# Patient Record
Sex: Female | Born: 1935 | Race: Black or African American | Hispanic: No | Smoking: Former smoker
Health system: Southern US, Community
[De-identification: ages and names within clinical notes are randomized; demographics above are authoritative.]

## PROBLEM LIST (undated history)

## (undated) DIAGNOSIS — Z8739 Personal history of other diseases of the musculoskeletal system and connective tissue: Secondary | ICD-10-CM

## (undated) DIAGNOSIS — E119 Type 2 diabetes mellitus without complications: Secondary | ICD-10-CM

## (undated) DIAGNOSIS — M791 Myalgia, unspecified site: Secondary | ICD-10-CM

## (undated) DIAGNOSIS — I10 Essential (primary) hypertension: Secondary | ICD-10-CM

## (undated) DIAGNOSIS — D649 Anemia, unspecified: Secondary | ICD-10-CM

## (undated) DIAGNOSIS — I4891 Unspecified atrial fibrillation: Secondary | ICD-10-CM

## (undated) DIAGNOSIS — R413 Other amnesia: Secondary | ICD-10-CM

## (undated) DIAGNOSIS — I509 Heart failure, unspecified: Secondary | ICD-10-CM

## (undated) DIAGNOSIS — H9190 Unspecified hearing loss, unspecified ear: Secondary | ICD-10-CM

## (undated) DIAGNOSIS — J449 Chronic obstructive pulmonary disease, unspecified: Secondary | ICD-10-CM

## (undated) DIAGNOSIS — R252 Cramp and spasm: Secondary | ICD-10-CM

## (undated) DIAGNOSIS — R233 Spontaneous ecchymoses: Secondary | ICD-10-CM

## (undated) DIAGNOSIS — M810 Age-related osteoporosis without current pathological fracture: Secondary | ICD-10-CM

## (undated) DIAGNOSIS — M069 Rheumatoid arthritis, unspecified: Secondary | ICD-10-CM

## (undated) DIAGNOSIS — J45909 Unspecified asthma, uncomplicated: Secondary | ICD-10-CM

## (undated) DIAGNOSIS — R238 Other skin changes: Secondary | ICD-10-CM

## (undated) HISTORY — DX: Other skin changes: R23.8

## (undated) HISTORY — DX: Chronic obstructive pulmonary disease, unspecified: J44.9

## (undated) HISTORY — DX: Spontaneous ecchymoses: R23.3

## (undated) HISTORY — DX: Personal history of other diseases of the musculoskeletal system and connective tissue: Z87.39

## (undated) HISTORY — DX: Myalgia, unspecified site: M79.10

## (undated) HISTORY — DX: Age-related osteoporosis without current pathological fracture: M81.0

## (undated) HISTORY — DX: Essential (primary) hypertension: I10

## (undated) HISTORY — DX: Anemia, unspecified: D64.9

## (undated) HISTORY — PX: HIP SURGERY: SHX245

## (undated) HISTORY — PX: KNEE SURGERY: SHX244

## (undated) HISTORY — DX: Cramp and spasm: R25.2

## (undated) HISTORY — PX: GALLBLADDER SURGERY: SHX652

## (undated) HISTORY — DX: Unspecified hearing loss, unspecified ear: H91.90

## (undated) HISTORY — DX: Unspecified atrial fibrillation: I48.91

## (undated) HISTORY — PX: COLONOSCOPY: SHX174

## (undated) HISTORY — DX: Type 2 diabetes mellitus without complications: E11.9

## (undated) HISTORY — DX: Other amnesia: R41.3

## (undated) HISTORY — DX: Rheumatoid arthritis, unspecified: M06.9

## (undated) HISTORY — DX: Heart failure, unspecified: I50.9

## (undated) HISTORY — DX: Unspecified asthma, uncomplicated: J45.909

## (undated) HISTORY — PX: EYE SURGERY: SHX253

---

## 1997-11-13 ENCOUNTER — Other Ambulatory Visit: Admission: RE | Admit: 1997-11-13 | Discharge: 1997-11-13 | Payer: Self-pay | Admitting: Obstetrics and Gynecology

## 1998-01-23 ENCOUNTER — Encounter: Payer: Self-pay | Admitting: Obstetrics and Gynecology

## 1998-01-23 ENCOUNTER — Ambulatory Visit (HOSPITAL_COMMUNITY): Admission: RE | Admit: 1998-01-23 | Discharge: 1998-01-23 | Payer: Self-pay | Admitting: Obstetrics and Gynecology

## 1999-03-04 ENCOUNTER — Other Ambulatory Visit: Admission: RE | Admit: 1999-03-04 | Discharge: 1999-03-04 | Payer: Self-pay | Admitting: Obstetrics and Gynecology

## 1999-03-04 ENCOUNTER — Encounter: Payer: Self-pay | Admitting: Obstetrics and Gynecology

## 1999-03-04 ENCOUNTER — Ambulatory Visit (HOSPITAL_COMMUNITY): Admission: RE | Admit: 1999-03-04 | Discharge: 1999-03-04 | Payer: Self-pay | Admitting: Obstetrics and Gynecology

## 2000-03-16 ENCOUNTER — Ambulatory Visit (HOSPITAL_COMMUNITY): Admission: RE | Admit: 2000-03-16 | Discharge: 2000-03-16 | Payer: Self-pay | Admitting: Obstetrics and Gynecology

## 2000-03-16 ENCOUNTER — Encounter: Payer: Self-pay | Admitting: Obstetrics and Gynecology

## 2000-03-16 ENCOUNTER — Other Ambulatory Visit: Admission: RE | Admit: 2000-03-16 | Discharge: 2000-03-16 | Payer: Self-pay | Admitting: Obstetrics and Gynecology

## 2001-05-26 ENCOUNTER — Ambulatory Visit (HOSPITAL_COMMUNITY): Admission: RE | Admit: 2001-05-26 | Discharge: 2001-05-26 | Payer: Self-pay | Admitting: Obstetrics and Gynecology

## 2001-05-26 ENCOUNTER — Encounter: Payer: Self-pay | Admitting: Obstetrics and Gynecology

## 2001-05-26 ENCOUNTER — Other Ambulatory Visit: Admission: RE | Admit: 2001-05-26 | Discharge: 2001-05-26 | Payer: Self-pay | Admitting: Obstetrics and Gynecology

## 2001-06-07 ENCOUNTER — Ambulatory Visit (HOSPITAL_COMMUNITY): Admission: RE | Admit: 2001-06-07 | Discharge: 2001-06-07 | Payer: Self-pay | Admitting: Obstetrics and Gynecology

## 2001-06-07 ENCOUNTER — Encounter: Payer: Self-pay | Admitting: Obstetrics and Gynecology

## 2004-04-18 ENCOUNTER — Ambulatory Visit: Payer: Self-pay | Admitting: Unknown Physician Specialty

## 2004-09-12 ENCOUNTER — Ambulatory Visit: Payer: Self-pay | Admitting: Internal Medicine

## 2004-10-17 ENCOUNTER — Ambulatory Visit: Payer: Self-pay | Admitting: Unknown Physician Specialty

## 2005-04-24 ENCOUNTER — Ambulatory Visit: Payer: Self-pay | Admitting: Unknown Physician Specialty

## 2005-05-08 ENCOUNTER — Ambulatory Visit: Payer: Self-pay | Admitting: Unknown Physician Specialty

## 2005-10-02 ENCOUNTER — Ambulatory Visit: Payer: Self-pay | Admitting: Internal Medicine

## 2006-01-08 ENCOUNTER — Ambulatory Visit: Payer: Self-pay | Admitting: Specialist

## 2006-01-08 ENCOUNTER — Ambulatory Visit: Payer: Self-pay

## 2006-05-05 ENCOUNTER — Ambulatory Visit: Payer: Self-pay | Admitting: Specialist

## 2006-10-07 ENCOUNTER — Ambulatory Visit: Payer: Self-pay | Admitting: Internal Medicine

## 2007-01-14 ENCOUNTER — Ambulatory Visit: Payer: Self-pay | Admitting: General Practice

## 2007-04-27 ENCOUNTER — Ambulatory Visit: Payer: Self-pay | Admitting: General Practice

## 2007-05-04 ENCOUNTER — Inpatient Hospital Stay: Payer: Self-pay | Admitting: General Practice

## 2007-05-11 ENCOUNTER — Encounter: Payer: Self-pay | Admitting: Internal Medicine

## 2007-09-22 ENCOUNTER — Inpatient Hospital Stay: Payer: Self-pay | Admitting: Specialist

## 2007-09-22 ENCOUNTER — Other Ambulatory Visit: Payer: Self-pay

## 2007-09-23 ENCOUNTER — Other Ambulatory Visit: Payer: Self-pay

## 2007-10-12 ENCOUNTER — Inpatient Hospital Stay: Payer: Self-pay | Admitting: Internal Medicine

## 2007-10-19 ENCOUNTER — Encounter: Payer: Self-pay | Admitting: Internal Medicine

## 2007-11-08 ENCOUNTER — Ambulatory Visit: Payer: Self-pay | Admitting: Unknown Physician Specialty

## 2007-11-26 ENCOUNTER — Ambulatory Visit: Payer: Self-pay | Admitting: Internal Medicine

## 2007-11-30 ENCOUNTER — Ambulatory Visit: Payer: Self-pay | Admitting: Internal Medicine

## 2008-01-06 ENCOUNTER — Ambulatory Visit: Payer: Self-pay | Admitting: Family Medicine

## 2008-01-06 DIAGNOSIS — M810 Age-related osteoporosis without current pathological fracture: Secondary | ICD-10-CM | POA: Insufficient documentation

## 2008-01-06 DIAGNOSIS — I10 Essential (primary) hypertension: Secondary | ICD-10-CM

## 2008-01-20 ENCOUNTER — Ambulatory Visit: Payer: Self-pay | Admitting: Family Medicine

## 2008-01-20 DIAGNOSIS — E669 Obesity, unspecified: Secondary | ICD-10-CM | POA: Insufficient documentation

## 2008-04-13 ENCOUNTER — Ambulatory Visit: Payer: Self-pay | Admitting: Internal Medicine

## 2008-07-31 ENCOUNTER — Ambulatory Visit: Payer: Self-pay | Admitting: Otolaryngology

## 2010-10-11 ENCOUNTER — Ambulatory Visit: Payer: Self-pay | Admitting: Ophthalmology

## 2010-10-21 ENCOUNTER — Ambulatory Visit: Payer: Self-pay | Admitting: Ophthalmology

## 2010-11-26 ENCOUNTER — Ambulatory Visit: Payer: Self-pay | Admitting: Unknown Physician Specialty

## 2011-11-19 ENCOUNTER — Ambulatory Visit: Payer: Self-pay | Admitting: Internal Medicine

## 2013-03-15 ENCOUNTER — Encounter: Payer: Self-pay | Admitting: Podiatry

## 2013-03-16 ENCOUNTER — Encounter: Payer: Self-pay | Admitting: Podiatry

## 2013-03-16 ENCOUNTER — Ambulatory Visit (INDEPENDENT_AMBULATORY_CARE_PROVIDER_SITE_OTHER): Payer: Medicare Other | Admitting: Podiatry

## 2013-03-16 VITALS — BP 134/51 | HR 68 | Resp 16 | Ht 65.0 in | Wt 162.0 lb

## 2013-03-16 DIAGNOSIS — E1149 Type 2 diabetes mellitus with other diabetic neurological complication: Secondary | ICD-10-CM

## 2013-03-16 DIAGNOSIS — Q828 Other specified congenital malformations of skin: Secondary | ICD-10-CM

## 2013-03-16 NOTE — Progress Notes (Signed)
She presents today with a chief complaint of painful porokeratotic lesions plantar aspect of the bilateral foot.  Objective: Vital signs are stable she is alert and oriented x3. Porokeratotic lesions sub-first sub-fifth and scattered about the foot bilaterally.  Assessment: Pain in limb secondary to porokeratotic lesions.  Plan: Debridement of all reactive hyperkeratosis followup with me as needed.

## 2013-04-21 ENCOUNTER — Ambulatory Visit: Payer: Self-pay | Admitting: Internal Medicine

## 2013-06-08 ENCOUNTER — Ambulatory Visit (INDEPENDENT_AMBULATORY_CARE_PROVIDER_SITE_OTHER): Payer: Medicare Other | Admitting: Podiatry

## 2013-06-08 ENCOUNTER — Encounter: Payer: Self-pay | Admitting: Podiatry

## 2013-06-08 VITALS — BP 148/76 | HR 60 | Resp 16

## 2013-06-08 DIAGNOSIS — Q828 Other specified congenital malformations of skin: Secondary | ICD-10-CM

## 2013-06-08 DIAGNOSIS — E1149 Type 2 diabetes mellitus with other diabetic neurological complication: Secondary | ICD-10-CM

## 2013-06-08 NOTE — Progress Notes (Signed)
My corns and calluses.  Objective: Vital signs are stable she is alert and oriented x3. She states her diabetes is doing well. Pulses are strongly palpable bilateral. Multiple areas of reactive hyperkeratosis plantar aspect of the bilateral foot and fifth digits bilaterally. No ulcerations no signs of infection.  Assessment: Diabetes with a reactive hyperkeratosis bilateral.  Plan: Debridement of all reactive hyperkeratosis today followup with her as needed.

## 2013-06-15 ENCOUNTER — Ambulatory Visit: Payer: Self-pay | Admitting: Podiatry

## 2013-07-13 DIAGNOSIS — I4891 Unspecified atrial fibrillation: Secondary | ICD-10-CM | POA: Insufficient documentation

## 2013-07-13 DIAGNOSIS — E7800 Pure hypercholesterolemia, unspecified: Secondary | ICD-10-CM | POA: Insufficient documentation

## 2013-07-13 DIAGNOSIS — M069 Rheumatoid arthritis, unspecified: Secondary | ICD-10-CM | POA: Insufficient documentation

## 2013-07-13 DIAGNOSIS — I6789 Other cerebrovascular disease: Secondary | ICD-10-CM | POA: Insufficient documentation

## 2013-07-13 DIAGNOSIS — B351 Tinea unguium: Secondary | ICD-10-CM | POA: Insufficient documentation

## 2013-07-13 DIAGNOSIS — J431 Panlobular emphysema: Secondary | ICD-10-CM | POA: Insufficient documentation

## 2013-07-13 DIAGNOSIS — J449 Chronic obstructive pulmonary disease, unspecified: Secondary | ICD-10-CM | POA: Insufficient documentation

## 2013-07-13 DIAGNOSIS — D649 Anemia, unspecified: Secondary | ICD-10-CM | POA: Insufficient documentation

## 2013-07-13 DIAGNOSIS — M898X9 Other specified disorders of bone, unspecified site: Secondary | ICD-10-CM | POA: Insufficient documentation

## 2013-07-13 DIAGNOSIS — M76899 Other specified enthesopathies of unspecified lower limb, excluding foot: Secondary | ICD-10-CM | POA: Insufficient documentation

## 2013-07-13 DIAGNOSIS — M659 Synovitis and tenosynovitis, unspecified: Secondary | ICD-10-CM | POA: Insufficient documentation

## 2013-07-13 DIAGNOSIS — E119 Type 2 diabetes mellitus without complications: Secondary | ICD-10-CM | POA: Insufficient documentation

## 2013-09-02 ENCOUNTER — Ambulatory Visit: Payer: Self-pay | Admitting: Internal Medicine

## 2013-09-12 ENCOUNTER — Encounter: Payer: Self-pay | Admitting: Podiatry

## 2013-09-12 ENCOUNTER — Ambulatory Visit: Payer: Medicare Other | Admitting: Podiatry

## 2013-09-12 DIAGNOSIS — M79609 Pain in unspecified limb: Secondary | ICD-10-CM

## 2013-09-12 DIAGNOSIS — E1149 Type 2 diabetes mellitus with other diabetic neurological complication: Secondary | ICD-10-CM

## 2013-09-12 DIAGNOSIS — Q828 Other specified congenital malformations of skin: Secondary | ICD-10-CM

## 2013-09-12 NOTE — Progress Notes (Signed)
She presents today with a chief complaint of painful elongated toenails and calluses.  Objective: Pulses are strongly palpable bilateral. Nails are thick yellow dystrophic with mycotic reactive hyperkeratosis plantar aspect of the bilateral foot as well as the digits.  Assessment: Reactive hyperkeratosis bilateral.  Plan: Debridement of all reactive hyperkeratosis.

## 2013-11-23 ENCOUNTER — Ambulatory Visit (INDEPENDENT_AMBULATORY_CARE_PROVIDER_SITE_OTHER): Payer: Medicare Other | Admitting: Podiatry

## 2013-11-23 DIAGNOSIS — E1149 Type 2 diabetes mellitus with other diabetic neurological complication: Secondary | ICD-10-CM

## 2013-11-23 DIAGNOSIS — Q828 Other specified congenital malformations of skin: Secondary | ICD-10-CM

## 2013-11-23 NOTE — Progress Notes (Signed)
She presents today with a chief complaint of painful corns and calluses plantar aspect of the bilateral foot.  Objective: Pulses are palpable bilateral. Multiple porokeratotic lesions plantar aspect of the bilateral foot.  Assessment: Porokeratosis bilateral.  Plan: Debridement of reactive hyperkeratosis secondary to pain bilateral.

## 2014-01-30 ENCOUNTER — Ambulatory Visit (INDEPENDENT_AMBULATORY_CARE_PROVIDER_SITE_OTHER): Payer: Medicare Other | Admitting: Podiatry

## 2014-01-30 DIAGNOSIS — Q828 Other specified congenital malformations of skin: Secondary | ICD-10-CM

## 2014-01-30 NOTE — Progress Notes (Signed)
She resists today complaining of corns and calluses to the bilateral foot.   Objective: Pulses are strongly palpable bilateral. Multiple porokeratotic lesions plantar aspect of the bilateral foot including distal clavi.  Assessment: Porokeratosis bilateral.  Plan: Debridement of all reactive hyperkeratosis and placed padding.

## 2014-02-01 ENCOUNTER — Ambulatory Visit: Payer: Federal, State, Local not specified - PPO | Admitting: Podiatry

## 2014-04-17 ENCOUNTER — Ambulatory Visit: Payer: Federal, State, Local not specified - PPO | Admitting: Podiatry

## 2014-04-19 ENCOUNTER — Ambulatory Visit (INDEPENDENT_AMBULATORY_CARE_PROVIDER_SITE_OTHER): Payer: Medicare Other | Admitting: Podiatry

## 2014-04-19 DIAGNOSIS — Q828 Other specified congenital malformations of skin: Secondary | ICD-10-CM

## 2014-04-19 NOTE — Progress Notes (Signed)
She presents today with chief complaint of painful calluses bilateral.  Objective: Vital signs are stable she is alert and oriented 3 pulses are palpable bilateral. Multiple porokeratotic lesions plantar aspect of the bilateral foot.  Assessment: Pain and limb secondary to porokeratosis bilateral.  Plan: Debridement of porokeratotic lesions plantar aspect bilateral foot.

## 2014-06-19 ENCOUNTER — Ambulatory Visit (INDEPENDENT_AMBULATORY_CARE_PROVIDER_SITE_OTHER): Payer: Medicare Other | Admitting: Podiatry

## 2014-06-19 ENCOUNTER — Encounter: Payer: Self-pay | Admitting: Podiatry

## 2014-06-19 DIAGNOSIS — Q828 Other specified congenital malformations of skin: Secondary | ICD-10-CM

## 2014-06-19 NOTE — Progress Notes (Signed)
She presents today with chief complaint of painful calluses bilateral.  Objective: She is alert and oriented 3 pulses are palpable bilateral. Multiple porokeratotic lesions plantar aspect of the bilateral foot.  Assessment: Pain and limb secondary to porokeratosis bilateral.  Plan: Debridement of porokeratotic lesions plantar aspect bilateral foot.

## 2014-09-06 ENCOUNTER — Ambulatory Visit (INDEPENDENT_AMBULATORY_CARE_PROVIDER_SITE_OTHER): Payer: Medicare Other | Admitting: Podiatry

## 2014-09-06 ENCOUNTER — Encounter: Payer: Self-pay | Admitting: Podiatry

## 2014-09-06 DIAGNOSIS — Q828 Other specified congenital malformations of skin: Secondary | ICD-10-CM | POA: Diagnosis not present

## 2014-09-06 NOTE — Progress Notes (Signed)
She presents today complaining of bilateral foot pain and calluses.  Objective: Vital signs are stable she is alert and oriented 3 pulses remain palpable bilateral but minimally so. Capillary fill time is immediate. Multiple varicosities and 4 keratomas noted to the plantar aspect of bilateral foot no open lesions no wounds.  Assessment: Porokeratosis bilateral.  Plan: Debridement already hyperkeratoses follow up with her in the near future for re-debridement.

## 2014-12-13 ENCOUNTER — Encounter: Payer: Self-pay | Admitting: Podiatry

## 2014-12-13 ENCOUNTER — Ambulatory Visit (INDEPENDENT_AMBULATORY_CARE_PROVIDER_SITE_OTHER): Payer: Medicare Other

## 2014-12-13 ENCOUNTER — Ambulatory Visit (INDEPENDENT_AMBULATORY_CARE_PROVIDER_SITE_OTHER): Payer: Medicare Other | Admitting: Podiatry

## 2014-12-13 DIAGNOSIS — M778 Other enthesopathies, not elsewhere classified: Secondary | ICD-10-CM

## 2014-12-13 DIAGNOSIS — Q828 Other specified congenital malformations of skin: Secondary | ICD-10-CM | POA: Diagnosis not present

## 2014-12-13 DIAGNOSIS — M7752 Other enthesopathy of left foot: Secondary | ICD-10-CM | POA: Diagnosis not present

## 2014-12-13 DIAGNOSIS — M779 Enthesopathy, unspecified: Principal | ICD-10-CM

## 2014-12-13 NOTE — Progress Notes (Signed)
She presents today with a chief complaint of pain to the first metatarsophalangeal joint extending along the medial aspect of the foot and up the anterior aspect of the left foot. She states that she also has pain here she points to the sinus tarsi of the left foot. She also goes on to state that she would like to have her corns and calluses debrided.  Objective: Vital signs are stable she is alert and oriented 3 pulses are strong palpable bilateral. Neurologic sensorium is intact per Semmes-Weinstein monofilament though does have a history of mild diabetic peripheral neuropathy. Multiple callosities plantar aspect of the bilateral foot and between the digits. Orthopedic evaluation demonstrates no reproducible pain on evaluation of the left foot. Radiographs 3 views of the left foot taken in the office today demonstrate a bunion deformity with osteoarthritic changes of the midfoot.  Assessment: Diabetic peripheral neuropathy with interdigital callosities and porokeratosis plantar aspect of the bilateral foot. Capsulitis first metatarsophalangeal joint left foot.  Plan: Debrided all reactive hyperkeratoses bilateral foot. More than forward debrided. I also offered her an injection to the first metatarsophalangeal joint and the sinus tarsi which she declined today. I will follow-up with her in 2 months for debridement.

## 2015-02-14 ENCOUNTER — Encounter: Payer: Self-pay | Admitting: Podiatry

## 2015-02-14 ENCOUNTER — Ambulatory Visit (INDEPENDENT_AMBULATORY_CARE_PROVIDER_SITE_OTHER): Payer: Medicare Other | Admitting: Podiatry

## 2015-02-14 DIAGNOSIS — Q828 Other specified congenital malformations of skin: Secondary | ICD-10-CM | POA: Diagnosis not present

## 2015-02-14 DIAGNOSIS — B351 Tinea unguium: Secondary | ICD-10-CM

## 2015-02-14 DIAGNOSIS — M79676 Pain in unspecified toe(s): Secondary | ICD-10-CM

## 2015-02-14 NOTE — Progress Notes (Signed)
She presents today with chief complaint of painful corns and calluses bilateral. She states that she doesn't want the callus on the plantar aspect of her right foot trimmed because it becomes painful on occasions.  Objective: Vital signs are stable she is alert and oriented 3. Pulses are palpable bilateral. Reactive hyperkeratosis of present plantar aspect bilateral foot no open lesions or wounds noted.  Assessment: Porokeratosis bilateral.  Plan: Debridement of all reactive hyperkeratotic tissue follow-up with [place] in the next few months.

## 2015-03-12 ENCOUNTER — Telehealth: Payer: Self-pay | Admitting: *Deleted

## 2015-03-12 DIAGNOSIS — M79673 Pain in unspecified foot: Secondary | ICD-10-CM

## 2015-03-12 NOTE — Telephone Encounter (Signed)
Tammy-can you call patient and let her know that we do have the RevitaDerm and discuss cost. Thanks!!

## 2015-03-12 NOTE — Telephone Encounter (Signed)
Called patient and she will pick up 2 jars of Revitaderm on Friday. Has paid $36 for both jars via [employer] made pyment over the phone . Th 03/12/2015

## 2015-03-12 NOTE — Telephone Encounter (Signed)
Pt states she is trying to see if the Revitaderm40 is available.

## 2015-04-16 ENCOUNTER — Ambulatory Visit: Payer: Medicare Other | Admitting: Podiatry

## 2015-04-18 ENCOUNTER — Ambulatory Visit: Payer: Medicare Other | Admitting: Podiatry

## 2015-04-20 ENCOUNTER — Encounter: Payer: Self-pay | Admitting: Sports Medicine

## 2015-04-20 ENCOUNTER — Ambulatory Visit (INDEPENDENT_AMBULATORY_CARE_PROVIDER_SITE_OTHER): Payer: Medicare Other | Admitting: Sports Medicine

## 2015-04-20 DIAGNOSIS — R29898 Other symptoms and signs involving the musculoskeletal system: Secondary | ICD-10-CM

## 2015-04-20 DIAGNOSIS — M21619 Bunion of unspecified foot: Secondary | ICD-10-CM

## 2015-04-20 DIAGNOSIS — Q828 Other specified congenital malformations of skin: Secondary | ICD-10-CM | POA: Diagnosis not present

## 2015-04-20 DIAGNOSIS — M79672 Pain in left foot: Secondary | ICD-10-CM

## 2015-04-20 DIAGNOSIS — L909 Atrophic disorder of skin, unspecified: Secondary | ICD-10-CM

## 2015-04-20 DIAGNOSIS — M79671 Pain in right foot: Secondary | ICD-10-CM

## 2015-04-20 DIAGNOSIS — M2040 Other hammer toe(s) (acquired), unspecified foot: Secondary | ICD-10-CM

## 2015-04-20 NOTE — Progress Notes (Addendum)
Patient ID: Tracy Acevedo, female   DOB: 1925-11-29, 82 y.o.   MRN: [id number] Subjective: Tracy Acevedo is a 82 y.o. female patient who presents to office for callus care; reports that when these build up can be very tender and difficult to walk due to pain; patient has been coming every 2 months for callus care which offers symptomatic relief. Patient also admits to positional numbness in right>left leg of which her PCP started her on meds for to help. Patient denies any other pedal complaints.   Patient Active Problem List   Diagnosis Date Noted  . OBESITY, UNSPECIFIED 01/20/2008  . ESSENTIAL HYPERTENSION, BENIGN 01/06/2008  . UNSPECIFIED OSTEOPOROSIS 01/06/2008   Current Outpatient Prescriptions on File Prior to Visit  Medication Sig Dispense Refill  . albuterol (VENTOLIN HFA) 108 (90 BASE) MCG/ACT inhaler Inhale 1 puff into the lungs every 6 (six) hours as needed for wheezing or shortness of breath.    . cetirizine (ZYRTEC) 10 MG tablet Take 10 mg by mouth daily.    . dabigatran (PRADAXA) 150 MG CAPS capsule Take 150 mg by mouth 2 (two) times daily.    . digoxin (LANOXIN) 0.25 MG tablet Take 0.25 mg by mouth daily.    Marland Kitchen diltiazem (TIAZAC) 360 MG 24 hr capsule Take 360 mg by mouth daily.    Marland Kitchen EPINEPHrine (EPIPEN) 0.3 mg/0.3 mL SOAJ injection Inject 0.3 mg into the muscle once.    Marland Kitchen esomeprazole (NEXIUM) 40 MG capsule Take 40 mg by mouth daily at 12 noon.    . Eszopiclone (ESZOPICLONE) 3 MG TABS Take 3 mg by mouth at bedtime. Take immediately before bedtime    . Fluticasone-Salmeterol (ADVAIR) 250-50 MCG/DOSE AEPB Inhale 1 puff into the lungs 2 (two) times daily.    . irbesartan-hydrochlorothiazide (AVALIDE) 300-12.5 MG per tablet Take 1 tablet by mouth daily.    . metoprolol succinate (TOPROL-XL) 50 MG 24 hr tablet Take 50 mg by mouth daily. Take with or immediately following a meal.    . montelukast (SINGULAIR) 10 MG tablet Take 10 mg by mouth at bedtime.    . Triamcinolone Acetonide  (NASACORT AQ NA) Place into the nose daily.     No current facility-administered medications on file prior to visit.   Allergies  Allergen Reactions  . Aspirin Nausea And Vomiting  . Penicillins Nausea And Vomiting  . Sulfa Antibiotics Nausea And Vomiting  . Tramadol Itching    hives  . Tramadol Hcl    Objective:  General: Alert and oriented x3 in no acute distress; hard of hearing; Walker assisted gait.  Dermatology: Keratotic lesion present with central nucleated core Left 5th toe dorsal PIPJ, sub met 5 and medial hallux, Right 5th toe dorsal PIPJ, sub met 5, 3rd toe distal tuft, and sub met 2 with no signs of underlying opening or infection, no webspace macerations, no ecchymosis bilateral, all nails x 10 are well manicured.  Vascular: Dorsalis Pedis and Posterior Tibial pedal pulses 1/4, Capillary Fill Time 3 seconds, scant pedal hair growth bilateral, no edema bilateral lower extremities, +mild varicosities bilateral, Temperature gradient within normal limits.  Neurology: Johney Maine sensation intact via light touch bilateral.  Musculoskeletal: Mild tenderness with palpation to keratotic lesions R>L, + Fat pad atrophy and structural deformity, Muscular strength within normal limits.   Assessment and Plan: Problem List Items Addressed This Visit    None    Visit Diagnoses    Porokeratosis    -  Primary    Fat pad atrophy  of foot        Bunion        Hammertoe, unspecified laterality        Foot pain, bilateral          -Complete examination performed -Discussed treatement options of porokeratotic lesions -Parred keratoic lesions x7 using a chisel blade; treated the area with Salinocaine covered with moleskin; keep intact for 1 day -Recommend good supportive shoes and inserts for foot type -Recommend daily skin emollients to help soften keratotic areas -Patient to return to office in 2 months for keratotic lesion/callus care or sooner if condition worsens.  Landis Martins,  DPM

## 2015-05-15 ENCOUNTER — Encounter: Payer: Self-pay | Admitting: Pharmacist

## 2015-06-19 ENCOUNTER — Encounter: Payer: Self-pay | Admitting: Sports Medicine

## 2015-06-19 ENCOUNTER — Ambulatory Visit: Payer: Medicare Other | Admitting: Sports Medicine

## 2015-06-19 ENCOUNTER — Ambulatory Visit (INDEPENDENT_AMBULATORY_CARE_PROVIDER_SITE_OTHER): Payer: Medicare Other | Admitting: Sports Medicine

## 2015-06-19 DIAGNOSIS — Q828 Other specified congenital malformations of skin: Secondary | ICD-10-CM

## 2015-06-19 DIAGNOSIS — M21619 Bunion of unspecified foot: Secondary | ICD-10-CM

## 2015-06-19 DIAGNOSIS — M79671 Pain in right foot: Secondary | ICD-10-CM | POA: Diagnosis not present

## 2015-06-19 DIAGNOSIS — M2040 Other hammer toe(s) (acquired), unspecified foot: Secondary | ICD-10-CM

## 2015-06-19 DIAGNOSIS — M79672 Pain in left foot: Secondary | ICD-10-CM

## 2015-06-19 DIAGNOSIS — E119 Type 2 diabetes mellitus without complications: Secondary | ICD-10-CM

## 2015-06-19 DIAGNOSIS — R29898 Other symptoms and signs involving the musculoskeletal system: Secondary | ICD-10-CM

## 2015-06-19 DIAGNOSIS — L909 Atrophic disorder of skin, unspecified: Secondary | ICD-10-CM

## 2015-06-19 NOTE — Progress Notes (Signed)
Patient ID: SILVA AAMODT, female   DOB: 09/29/1925, 82 y.o.   MRN: [id number]  Subjective: Tracy Acevedo is a 82 y.o. female patient who returns to office for callus care; patient has been coming every 2 months for callus care which offers symptomatic relief. Patient admits to a fall since last visit with no injury or loss of consciousness. Patient denies any other pedal complaints.   Patient Active Problem List   Diagnosis Date Noted  . Acute ill-defined cerebrovascular disease 07/13/2013  . Absolute anemia 07/13/2013  . Asthma-chronic obstructive pulmonary disease overlap syndrome ([place]) 07/13/2013  . Atrial fibrillation ([place]) 07/13/2013  . Chronic obstructive pulmonary disease ([place]) 07/13/2013  . Dermatophytic onychia 07/13/2013  . Diabetes mellitus ([place]) 07/13/2013  . Enthesopathy of hip 07/13/2013  . Bony exostosis 07/13/2013  . Pure hypercholesterolemia 07/13/2013  . Rheumatoid arthritis ([place]) 07/13/2013  . Synovitis and tenosynovitis 07/13/2013  . OBESITY, UNSPECIFIED 01/20/2008  . Essential hypertension, benign 01/06/2008  . UNSPECIFIED OSTEOPOROSIS 01/06/2008   Current Outpatient Prescriptions on File Prior to Visit  Medication Sig Dispense Refill  . albuterol (VENTOLIN HFA) 108 (90 BASE) MCG/ACT inhaler Inhale 1 puff into the lungs every 6 (six) hours as needed for wheezing or shortness of breath.    . cetirizine (ZYRTEC) 10 MG tablet Take 10 mg by mouth daily.    . dabigatran (PRADAXA) 150 MG CAPS capsule Take 150 mg by mouth 2 (two) times daily.    . digoxin (LANOXIN) 0.25 MG tablet Take 0.25 mg by mouth daily.    Marland Kitchen diltiazem (TIAZAC) 360 MG 24 hr capsule Take 360 mg by mouth daily.    Marland Kitchen EPINEPHrine (EPIPEN) 0.3 mg/0.3 mL SOAJ injection Inject 0.3 mg into the muscle once.    Marland Kitchen esomeprazole (NEXIUM) 40 MG capsule Take 40 mg by mouth daily at 12 noon.    . Eszopiclone (ESZOPICLONE) 3 MG TABS Take 3 mg by mouth at bedtime. Take immediately before bedtime    .  Fluticasone-Salmeterol (ADVAIR) 250-50 MCG/DOSE AEPB Inhale 1 puff into the lungs 2 (two) times daily.    . irbesartan-hydrochlorothiazide (AVALIDE) 300-12.5 MG per tablet Take 1 tablet by mouth daily.    . metoprolol succinate (TOPROL-XL) 50 MG 24 hr tablet Take 50 mg by mouth daily. Take with or immediately following a meal.    . montelukast (SINGULAIR) 10 MG tablet Take 10 mg by mouth at bedtime.    . Triamcinolone Acetonide (NASACORT AQ NA) Place into the nose daily.     No current facility-administered medications on file prior to visit.   Allergies  Allergen Reactions  . Aspirin Nausea And Vomiting and Nausea Only  . Penicillins Nausea And Vomiting, Hives and Itching  . Sulfa Antibiotics Nausea And Vomiting, Hives and Itching  . Tramadol Itching and Hives    hives  . Tramadol Hcl    Objective:  General: Alert and oriented x3 in no acute distress; hard of hearing; Walker assisted gait.  Dermatology: Keratotic lesion present with central nucleated core Left medial 2nd toe, 5th toe dorsal PIPJ, sub met 5 and medial hallux, Right 5th toe dorsal PIPJ, sub met 5, 3rd toe distal tuft, and sub met 2 with no signs of underlying opening or infection, no webspace macerations, no ecchymosis bilateral, all nails x 10 are well manicured; patient gets pedicures.  Vascular: Dorsalis Pedis and Posterior Tibial pedal pulses 1/4, Capillary Fill Time 3 seconds, scant pedal hair growth bilateral, no edema bilateral lower extremities, +mild varicosities bilateral, Temperature  gradient within normal limits.  Neurology: Johney Maine sensation intact via light touch bilateral.  Musculoskeletal: Mild tenderness with palpation to keratotic lesions R>L, + Fat pad atrophy and structural deformity of bunion and hammertoes, Muscular strength within normal limits.   Assessment and Plan: Problem List Items Addressed This Visit    None    Visit Diagnoses    Porokeratosis    -  Primary    Fat pad atrophy of foot         Bunion        Hammertoe, unspecified laterality        Foot pain, bilateral          -Complete examination performed -Discussed treatement options of porokeratotic lesions -Parred keratoic lesions x8 using a chisel blade; treated the area with Salinocaine covered with moleskin; keep intact for 1 day -Recommend good supportive shoes and inserts for foot type -Recommend daily skin emollients to help soften keratotic areas -Patient to return to office in 2 months for keratotic lesion/callus care or sooner if condition worsens.  Landis Martins, DPM

## 2015-07-09 ENCOUNTER — Encounter: Payer: Self-pay | Admitting: Pharmacist

## 2015-09-04 ENCOUNTER — Ambulatory Visit (INDEPENDENT_AMBULATORY_CARE_PROVIDER_SITE_OTHER): Payer: Medicare Other | Admitting: Sports Medicine

## 2015-09-04 ENCOUNTER — Encounter: Payer: Self-pay | Admitting: Sports Medicine

## 2015-09-04 DIAGNOSIS — M2040 Other hammer toe(s) (acquired), unspecified foot: Secondary | ICD-10-CM

## 2015-09-04 DIAGNOSIS — M79671 Pain in right foot: Secondary | ICD-10-CM

## 2015-09-04 DIAGNOSIS — Q828 Other specified congenital malformations of skin: Secondary | ICD-10-CM | POA: Diagnosis not present

## 2015-09-04 DIAGNOSIS — E119 Type 2 diabetes mellitus without complications: Secondary | ICD-10-CM

## 2015-09-04 DIAGNOSIS — M79672 Pain in left foot: Secondary | ICD-10-CM

## 2015-09-04 DIAGNOSIS — L909 Atrophic disorder of skin, unspecified: Secondary | ICD-10-CM

## 2015-09-04 DIAGNOSIS — M79673 Pain in unspecified foot: Secondary | ICD-10-CM

## 2015-09-04 DIAGNOSIS — M21619 Bunion of unspecified foot: Secondary | ICD-10-CM

## 2015-09-04 DIAGNOSIS — R29898 Other symptoms and signs involving the musculoskeletal system: Secondary | ICD-10-CM

## 2015-09-04 NOTE — Progress Notes (Signed)
Patient ID: Tracy Acevedo, female   DOB: [birth year], 82 y.o.   MRN: [id number]  Subjective: Tracy Acevedo is a 82 y.o. Diabetic female patient who returns to office for callus care; patient has been coming every 2 months for callus care which offers symptomatic relief. Patient denies any other pedal complaints.   FBS not recorded  Patient Active Problem List   Diagnosis Date Noted  . Acute ill-defined cerebrovascular disease 07/13/2013  . Absolute anemia 07/13/2013  . Asthma-chronic obstructive pulmonary disease overlap syndrome ([place]) 07/13/2013  . Atrial fibrillation ([place]) 07/13/2013  . Chronic obstructive pulmonary disease ([place]) 07/13/2013  . Dermatophytic onychia 07/13/2013  . Diabetes mellitus ([place]) 07/13/2013  . Enthesopathy of hip 07/13/2013  . Bony exostosis 07/13/2013  . Pure hypercholesterolemia 07/13/2013  . Rheumatoid arthritis ([place]) 07/13/2013  . Synovitis and tenosynovitis 07/13/2013  . Panlobular emphysema ([place]) 07/13/2013  . Rheumatoid arthritis involving multiple joints ([place]) 07/13/2013  . OBESITY, UNSPECIFIED 01/20/2008  . Essential hypertension, benign 01/06/2008  . UNSPECIFIED OSTEOPOROSIS 01/06/2008   Current Outpatient Prescriptions on File Prior to Visit  Medication Sig Dispense Refill  . albuterol (VENTOLIN HFA) 108 (90 BASE) MCG/ACT inhaler Inhale 1 puff into the lungs every 6 (six) hours as needed for wheezing or shortness of breath.    . cetirizine (ZYRTEC) 10 MG tablet Take 10 mg by mouth daily.    . dabigatran (PRADAXA) 150 MG CAPS capsule Take 150 mg by mouth 2 (two) times daily.    . digoxin (LANOXIN) 0.25 MG tablet Take 0.25 mg by mouth daily.    Tracy Acevedo diltiazem (CARDIZEM CD) 360 MG 24 hr capsule Take by mouth.    . diltiazem (TIAZAC) 360 MG 24 hr capsule Take 360 mg by mouth daily.    Tracy Acevedo EPINEPHrine (EPIPEN) 0.3 mg/0.3 mL SOAJ injection Inject 0.3 mg into the muscle once.    Tracy Acevedo esomeprazole (NEXIUM) 40 MG capsule Take 40 mg by mouth daily at 12 noon.     . Eszopiclone (ESZOPICLONE) 3 MG TABS Take 3 mg by mouth at bedtime. Take immediately before bedtime    . Fluticasone-Salmeterol (ADVAIR) 250-50 MCG/DOSE AEPB Inhale 1 puff into the lungs 2 (two) times daily.    . irbesartan-hydrochlorothiazide (AVALIDE) 300-12.5 MG per tablet Take 1 tablet by mouth daily.    . metoprolol succinate (TOPROL-XL) 50 MG 24 hr tablet Take 50 mg by mouth daily. Take with or immediately following a meal.    . montelukast (SINGULAIR) 10 MG tablet Take 10 mg by mouth at bedtime.    . Triamcinolone Acetonide (NASACORT AQ NA) Place into the nose daily.     No current facility-administered medications on file prior to visit.   Allergies  Allergen Reactions  . Aspirin Nausea And Vomiting and Nausea Only  . Penicillins Nausea And Vomiting, Hives and Itching  . Sulfa Antibiotics Nausea And Vomiting, Hives and Itching  . Tramadol Itching and Hives    hives  . Tramadol Hcl    Objective:  General: Alert and oriented x3 in no acute distress; hard of hearing; Walker assisted gait.  Dermatology: Keratotic lesion present with central nucleated core Left medial hallux and 2nd toe, 5th toe dorsal PIPJ, sub met 5, Right 5th toe dorsal PIPJ, sub met 5 and base of 5th met, 3rd toe distal tuft, and sub met 2 with no signs of underlying opening or infection, no webspace macerations, no ecchymosis bilateral, all nails x 10 are well manicured; patient gets pedicures.  Vascular: Dorsalis Pedis and  Posterior Tibial pedal pulses 1/4, Capillary Fill Time 3 seconds, scant pedal hair growth bilateral, no edema bilateral lower extremities, +mild varicosities bilateral, Temperature gradient within normal limits.  Neurology: Johney Maine sensation intact via light touch bilateral.  Musculoskeletal: Mild tenderness with palpation to keratotic lesions R>L, + Fat pad atrophy and structural deformity of bunion and hammertoes, Muscular strength within normal limits.   Assessment and Plan: Problem List  Items Addressed This Visit    None    Visit Diagnoses    Porokeratosis    -  Primary    Fat pad atrophy of foot        Bunion        Hammertoe, unspecified laterality        Foot pain, bilateral        Diabetes mellitus without complication (HCC)          -Complete examination performed -Discussed treatement options of porokeratotic lesions -Encouraged daily inspection in the setting of diabetes -Parred keratoic lesions using a chisel blade; treated the area with Salinocaine covered with moleskin; keep intact for 1 day -Recommend good supportive shoes and inserts for foot type -Recommend daily skin emollients to help soften keratotic areas -Patient to return to office in 2 months for at risk Diabetickeratotic lesion/callus care or sooner if condition worsens.  Tracy Martins, Acevedo

## 2015-09-19 DIAGNOSIS — Z9842 Cataract extraction status, left eye: Secondary | ICD-10-CM | POA: Insufficient documentation

## 2015-09-19 DIAGNOSIS — Z961 Presence of intraocular lens: Secondary | ICD-10-CM | POA: Insufficient documentation

## 2015-11-13 ENCOUNTER — Encounter: Payer: Self-pay | Admitting: Sports Medicine

## 2015-11-13 ENCOUNTER — Ambulatory Visit (INDEPENDENT_AMBULATORY_CARE_PROVIDER_SITE_OTHER): Payer: Medicare Other | Admitting: Sports Medicine

## 2015-11-13 DIAGNOSIS — M79671 Pain in right foot: Secondary | ICD-10-CM

## 2015-11-13 DIAGNOSIS — Q828 Other specified congenital malformations of skin: Secondary | ICD-10-CM

## 2015-11-13 DIAGNOSIS — L909 Atrophic disorder of skin, unspecified: Secondary | ICD-10-CM

## 2015-11-13 DIAGNOSIS — R29898 Other symptoms and signs involving the musculoskeletal system: Secondary | ICD-10-CM

## 2015-11-13 DIAGNOSIS — M79672 Pain in left foot: Secondary | ICD-10-CM

## 2015-11-13 DIAGNOSIS — M21619 Bunion of unspecified foot: Secondary | ICD-10-CM

## 2015-11-13 DIAGNOSIS — M2040 Other hammer toe(s) (acquired), unspecified foot: Secondary | ICD-10-CM

## 2015-11-13 DIAGNOSIS — E119 Type 2 diabetes mellitus without complications: Secondary | ICD-10-CM

## 2015-11-13 NOTE — Progress Notes (Signed)
Patient ID: Tracy Acevedo, female   DOB: Feb 03, 1926, 82 y.o.   MRN: [id number]  Subjective: Tracy Acevedo is a 82 y.o. Diabetic female patient who returns to office for callus care; patient has been coming every 2 months for callus care which offers symptomatic relief. Patient denies any other pedal complaints.   FBS not recorded  Patient Active Problem List   Diagnosis Date Noted  . Acute ill-defined cerebrovascular disease 07/13/2013  . Absolute anemia 07/13/2013  . Asthma-chronic obstructive pulmonary disease overlap syndrome ([facility]) 07/13/2013  . Atrial fibrillation ([facility]) 07/13/2013  . Chronic obstructive pulmonary disease ([facility]) 07/13/2013  . Dermatophytic onychia 07/13/2013  . Diabetes mellitus ([facility]) 07/13/2013  . Enthesopathy of hip 07/13/2013  . Bony exostosis 07/13/2013  . Pure hypercholesterolemia 07/13/2013  . Rheumatoid arthritis ([facility]) 07/13/2013  . Synovitis and tenosynovitis 07/13/2013  . Panlobular emphysema ([facility]) 07/13/2013  . Rheumatoid arthritis involving multiple joints ([facility]) 07/13/2013  . OBESITY, UNSPECIFIED 01/20/2008  . Essential hypertension, benign 01/06/2008  . UNSPECIFIED OSTEOPOROSIS 01/06/2008   Current Outpatient Prescriptions on File Prior to Visit  Medication Sig Dispense Refill  . albuterol (VENTOLIN HFA) 108 (90 BASE) MCG/ACT inhaler Inhale 1 puff into the lungs every 6 (six) hours as needed for wheezing or shortness of breath.    . cetirizine (ZYRTEC) 10 MG tablet Take 10 mg by mouth daily.    . dabigatran (PRADAXA) 150 MG CAPS capsule Take 150 mg by mouth 2 (two) times daily.    . digoxin (LANOXIN) 0.25 MG tablet Take 0.25 mg by mouth daily.    Marland Kitchen diltiazem (CARDIZEM CD) 360 MG 24 hr capsule Take by mouth.    . diltiazem (TIAZAC) 360 MG 24 hr capsule Take 360 mg by mouth daily.    Marland Kitchen EPINEPHrine (EPIPEN) 0.3 mg/0.3 mL SOAJ injection Inject 0.3 mg into the muscle once.    Marland Kitchen esomeprazole (NEXIUM) 40 MG capsule Take 40 mg by mouth daily at 12 noon.     . Eszopiclone (ESZOPICLONE) 3 MG TABS Take 3 mg by mouth at bedtime. Take immediately before bedtime    . Fluticasone-Salmeterol (ADVAIR) 250-50 MCG/DOSE AEPB Inhale 1 puff into the lungs 2 (two) times daily.    . irbesartan-hydrochlorothiazide (AVALIDE) 300-12.5 MG per tablet Take 1 tablet by mouth daily.    . metoprolol succinate (TOPROL-XL) 50 MG 24 hr tablet Take 50 mg by mouth daily. Take with or immediately following a meal.    . montelukast (SINGULAIR) 10 MG tablet Take 10 mg by mouth at bedtime.    . Triamcinolone Acetonide (NASACORT AQ NA) Place into the nose daily.     No current facility-administered medications on file prior to visit.    Allergies  Allergen Reactions  . Aspirin Nausea And Vomiting and Nausea Only  . Penicillins Nausea And Vomiting, Hives and Itching  . Sulfa Antibiotics Nausea And Vomiting, Hives and Itching  . Tramadol Itching and Hives    hives hives  . Tramadol Hcl    Objective:  General: Alert and oriented x3 in no acute distress; hard of hearing; Walker assisted gait.  Dermatology: Keratotic lesion present with central nucleated core Left medial hallux and 2nd toe, 5th toe dorsal PIPJ, sub met 5, Right 5th toe dorsal PIPJ, sub met 5 and base of 5th met, 3rd toe distal tuft, and sub met 2 with no signs of underlying opening or infection, no webspace macerations, no ecchymosis bilateral, all nails x 10 are well manicured; patient gets pedicures.  Vascular: Dorsalis  Pedis and Posterior Tibial pedal pulses 1/4, Capillary Fill Time 3 seconds, scant pedal hair growth bilateral, no edema bilateral lower extremities, +mild varicosities bilateral, Temperature gradient within normal limits.  Neurology: Johney Maine sensation intact via light touch bilateral.  Musculoskeletal: Mild tenderness with palpation to keratotic lesions R>L, + Fat pad atrophy and structural deformity of bunion and hammertoes, Muscular strength within normal limits.   Assessment and  Plan: Problem List Items Addressed This Visit    None    Visit Diagnoses    Porokeratosis    -  Primary   Fat pad atrophy of foot       Bunion       Hammertoe, unspecified laterality       Foot pain, bilateral       Diabetes mellitus without complication (HCC)         -Complete examination performed -Discussed treatement options of porokeratotic lesions -Encouraged daily inspection in the setting of diabetes -Parred keratoic lesions using a chisel blade; treated the area with Salinocaine covered with moleskin; keep intact for 1 day -Recommend good supportive shoes and inserts for foot type -Recommend daily skin emollients to help soften keratotic areas -Patient to return to office in 2 months for at risk Diabetic keratotic lesion/callus care or sooner if condition worsens.  Landis Martins, DPM

## 2016-01-14 ENCOUNTER — Ambulatory Visit (INDEPENDENT_AMBULATORY_CARE_PROVIDER_SITE_OTHER): Payer: Medicare Other | Admitting: Podiatry

## 2016-01-14 ENCOUNTER — Encounter: Payer: Self-pay | Admitting: Podiatry

## 2016-01-14 DIAGNOSIS — Q828 Other specified congenital malformations of skin: Secondary | ICD-10-CM | POA: Diagnosis not present

## 2016-01-14 NOTE — Progress Notes (Signed)
She presents today with a chief complaint of painful corns and calluses to the plantar aspect of the bilateral foot.  Objective: Pulses remain palpable. No open lesions or wounds are noted. Multiple porokeratotic lesions and tyloma is bilateral.  Assessment: Pain in limb secondary to porokeratosis tyloma is bilateral.  Plan: Debridement of all reactive hyperkeratoses.

## 2016-03-17 ENCOUNTER — Ambulatory Visit (INDEPENDENT_AMBULATORY_CARE_PROVIDER_SITE_OTHER): Payer: Medicare Other | Admitting: Podiatry

## 2016-03-17 ENCOUNTER — Ambulatory Visit: Payer: Medicare Other | Admitting: Podiatry

## 2016-03-17 ENCOUNTER — Encounter: Payer: Self-pay | Admitting: Podiatry

## 2016-03-17 DIAGNOSIS — Q828 Other specified congenital malformations of skin: Secondary | ICD-10-CM | POA: Diagnosis not present

## 2016-03-17 DIAGNOSIS — E119 Type 2 diabetes mellitus without complications: Secondary | ICD-10-CM | POA: Diagnosis not present

## 2016-03-17 NOTE — Progress Notes (Signed)
She presents today for follow-up of her calluses and plantar aspect of the bilateral foot.  Objective: Vital signs are stable alert and oriented 3. Toenails do not need to be trimmed today. Pulses remain palpable. Multiple calluses of plantar aspect of the bilateral foot and toes. Greater than 4 in number.  Assessment: Pain in limb secondary to porokeratosis and calluses bilateral.  Plan: Debridement of all reactive hyperkeratotic tissue follow up with her in a couple of months.

## 2016-05-19 ENCOUNTER — Encounter: Payer: Self-pay | Admitting: Pharmacist

## 2016-05-21 ENCOUNTER — Ambulatory Visit (INDEPENDENT_AMBULATORY_CARE_PROVIDER_SITE_OTHER): Payer: Medicare Other | Admitting: Podiatry

## 2016-05-21 ENCOUNTER — Encounter: Payer: Self-pay | Admitting: Podiatry

## 2016-05-21 DIAGNOSIS — E119 Type 2 diabetes mellitus without complications: Secondary | ICD-10-CM | POA: Diagnosis not present

## 2016-05-21 DIAGNOSIS — Q828 Other specified congenital malformations of skin: Secondary | ICD-10-CM

## 2016-05-21 NOTE — Progress Notes (Signed)
She presents today chief complaint of painful thickened corns and poor keratoma sub-plantar aspect of the bilateral foot.  Objective: Vital signs are stable she is alert and oriented 3. Pulses are palpable. No erythema edema cellulitis drainage or odor thick yellow calcinosis of plantar aspect of the forefoot bilateral.  Assessment: Porokeratosis plantar aspect of bilateral foot.  Plan: Debridement of porokeratosis bilateral.

## 2016-06-23 ENCOUNTER — Encounter (INDEPENDENT_AMBULATORY_CARE_PROVIDER_SITE_OTHER): Payer: Self-pay

## 2016-06-23 ENCOUNTER — Encounter: Payer: Self-pay | Admitting: Pharmacist

## 2016-06-23 ENCOUNTER — Ambulatory Visit: Payer: Self-pay | Admitting: Pharmacist

## 2016-06-23 DIAGNOSIS — Z79899 Other long term (current) drug therapy: Secondary | ICD-10-CM

## 2016-06-23 NOTE — Patient Instructions (Addendum)
Reggie Bise to contact Dr. Judithann Sheen about checking a thyroid level at next visit.  You may want to do a trial off of the Probiotic. If you do not have any issues with the bowels, you probably don't need to continue taking the probiotic.

## 2016-06-26 NOTE — Progress Notes (Signed)
Medication Management Clinic Visit Note  Patient: Tracy Acevedo MRN: [id number] Date of Birth: 10/13/1925 PCP: Marguarite Arbour, MD   Tracy Acevedo 83 y.o. female presents for an annual medication review visit today. She is in good spirits and her main concern is her thinning hair and brittle nails. She also states that she has occasional cramping in her fingers and thighs.  There were no vitals taken for this visit.  Patient Information   Past Medical History:  Diagnosis Date  . AF (atrial fibrillation) (HCC)   . Anemia   . Arthritis, rheumatoid (HCC)   . Asthma   . Bruises easily   . Diabetes (HCC)   . Hearing loss   . History of swelling of feet   . Memory loss   . Muscle cramp   . Muscle pain   . Osteoporosis       Past Surgical History:  Procedure Laterality Date  . COLONOSCOPY    . EYE SURGERY Right   . GALLBLADDER SURGERY    . HIP SURGERY Bilateral   . KNEE SURGERY Right     No family history on file.  New Diagnoses (since last visit):   Family Support: Good  Lifestyle Diet: Breakfast: Fruit Smoothie Lunch: Vegetables Smoothie Dinner: Alternatives Meat and Veggies Drinks: Water, Boost/Ensure daily            History  Alcohol Use  . Yes    Comment: social      History  Smoking Status  . Former Smoker  Smokeless Tobacco  . Never Used      Health Maintenance  Topic Date Due  . HEMOGLOBIN A1C  March 07, 1928  . FOOT EXAM  11/09/1937  . OPHTHALMOLOGY EXAM  11/09/1937  . TETANUS/TDAP  11/10/1946  . DEXA SCAN  11/09/1992  . PNA vac Low Risk Adult (1 of 2 - PCV13) 11/09/1992  . INFLUENZA VACCINE  10/30/2015   Outpatient Encounter Prescriptions as of 06/23/2016  Medication Sig  . albuterol (VENTOLIN HFA) 108 (90 BASE) MCG/ACT inhaler Inhale 1 puff into the lungs every 6 (six) hours as needed for wheezing or shortness of breath.  Marland Kitchen BIOTIN PO Take by mouth daily. Dose unknown  . Calcium Carbonate-Vitamin D (CALTRATE 600+D PO) Take 1  tablet by mouth daily.  . cetirizine (ZYRTEC) 10 MG tablet Take 10 mg by mouth daily.  . dabigatran (PRADAXA) 150 MG CAPS capsule Take 150 mg by mouth 2 (two) times daily.  . digoxin (LANOXIN) 0.125 MG tablet Take 0.125 mg by mouth daily.  Marland Kitchen diltiazem (TIAZAC) 360 MG 24 hr capsule Take 360 mg by mouth daily.  Marland Kitchen EPINEPHrine (EPIPEN) 0.3 mg/0.3 mL SOAJ injection Inject 0.3 mg into the muscle once.  Marland Kitchen esomeprazole (NEXIUM) 40 MG capsule Take 40 mg by mouth daily at 12 noon.  . Eszopiclone (ESZOPICLONE) 3 MG TABS Take 3 mg by mouth at bedtime. Take immediately before bedtime  . Fluticasone-Salmeterol (ADVAIR) 100-50 MCG/DOSE AEPB Inhale 1 puff into the lungs 2 (two) times daily. Advair  . Fluticasone-Salmeterol (ADVAIR) 250-50 MCG/DOSE AEPB Inhale 1 puff into the lungs 2 (two) times daily.  Marland Kitchen losartan-hydrochlorothiazide (HYZAAR) 100-12.5 MG tablet Take 1 tablet by mouth daily.  Marland Kitchen MELATONIN PO Take 1 capsule by mouth daily.  . Methylsulfonylmethane (MSM PO) Take 1 tablet by mouth daily. MSM Complex  . montelukast (SINGULAIR) 10 MG tablet Take 10 mg by mouth at bedtime.  . Multiple Vitamins-Minerals (MULTIVITAMIN ADULT PO) Take 1 tablet by mouth daily.  Marland Kitchen  nebivolol (BYSTOLIC) 5 MG tablet Take 5 mg by mouth daily.  . Probiotic Product (PROBIOTIC DAILY PO) Take 1 capsule by mouth daily.  . S-Adenosylmethionine (SAM-E PO) Take 1 tablet by mouth daily.  . Triamcinolone Acetonide (NASACORT AQ NA) Place into the nose daily.  . [DISCONTINUED] digoxin (LANOXIN) 0.25 MG tablet Take 0.25 mg by mouth daily.  . [DISCONTINUED] diltiazem (CARDIZEM CD) 360 MG 24 hr capsule Take by mouth.  . [DISCONTINUED] irbesartan-hydrochlorothiazide (AVALIDE) 300-12.5 MG per tablet Take 1 tablet by mouth daily.  . [DISCONTINUED] metoprolol succinate (TOPROL-XL) 50 MG 24 hr tablet Take 50 mg by mouth daily. Take with or immediately following a meal.   No facility-administered encounter medications on file as of 06/23/2016.      Assessment and Plan: Compliance: Medications were reviewed. She is compliant with her medications and uses a pill box.  Health Care: She is followed by several specialists: cardiologist, pulmonologist, gastroenterologist, orthopedist, ENT, dermatologist and podiatrist in addition to her primary care physician. Cardiac: Currently on losartan-HCTZ, nebivolol, digoxin, diltiazem and dabigatran. Last digoxin level was 1.5 ng/ml on 08/14/16. Digoxin dose was decreased from 0.25 mg to 0.125 mg around September 2017. Patient denies bleeding or bruising.  Cholesterol: TC = 170 mg/dl; TG = 80 mg/dl; HDL = 12.7 mg/dl; LDL = 93 mg/dl on 51/7001. Patient continues to eat a healthy diet of fruit and vegetable smoothies. Due to her lack of taste buds, she no longer enjoys eating, but eats to sustain her weight.  Asthma/COPD/Allergies: No issues. Currently on Ventolin, Advair, cetirizine and montelukast. General: Tracy Acevedo expressed frustration with feeling lethargic. Did not see a recent thyroid level, patient was not sure when this was last checked. Will recommend thyroid level at next PCP visit. She is looking into getting the Medical Alert system at the request of her children. Discussed the possibility of her stopping the probiotic as she has no digestive issues.  RTC in 1 year.  Breezy Hertenstein K. Joelene Millin, PharmD [facility] Clinic-Pharmacy Operations Coordinator [phone]

## 2016-07-28 ENCOUNTER — Ambulatory Visit (INDEPENDENT_AMBULATORY_CARE_PROVIDER_SITE_OTHER): Payer: Medicare Other | Admitting: Podiatry

## 2016-07-28 ENCOUNTER — Encounter: Payer: Self-pay | Admitting: Podiatry

## 2016-07-28 DIAGNOSIS — Q828 Other specified congenital malformations of skin: Secondary | ICD-10-CM | POA: Diagnosis not present

## 2016-07-28 DIAGNOSIS — E119 Type 2 diabetes mellitus without complications: Secondary | ICD-10-CM

## 2016-07-28 NOTE — Progress Notes (Signed)
She presents today with a chief complaint of painful porokeratosis and calluses to the bilateral foot. She denies any new complaints.  Objective: Vital signs are stable she is alert and oriented 3. Pulses are palpable. No erythema edema cellulitis drainage or odor. Hammertoes are noticeable bilateral. She has reactive hyperkeratosis with distal clavi porokeratotic lesions to the plantar first and fifth left to the plantar second fifth right multiple porokeratosis to the bilateral lateral aspect of foot along the fifth metatarsal head and base. No heel lesions. No open lesions.  Assessment: Porokeratosis hammertoe deformities bilateral.  Plan: Debridement of reactive hyperkeratosis was porokeratotic lesions bilateral. Follow up with her in a couple of months.

## 2016-09-29 ENCOUNTER — Ambulatory Visit: Payer: Medicare Other | Admitting: Podiatry

## 2016-10-06 ENCOUNTER — Ambulatory Visit (INDEPENDENT_AMBULATORY_CARE_PROVIDER_SITE_OTHER): Payer: Medicare Other | Admitting: Podiatry

## 2016-10-06 DIAGNOSIS — Q828 Other specified congenital malformations of skin: Secondary | ICD-10-CM | POA: Diagnosis not present

## 2016-10-06 DIAGNOSIS — L909 Atrophic disorder of skin, unspecified: Secondary | ICD-10-CM

## 2016-10-06 DIAGNOSIS — R29898 Other symptoms and signs involving the musculoskeletal system: Secondary | ICD-10-CM

## 2016-10-06 DIAGNOSIS — E119 Type 2 diabetes mellitus without complications: Secondary | ICD-10-CM

## 2016-10-06 NOTE — Progress Notes (Signed)
Complaint:  Visit Type: Patient returns to my office for continued preventative foot care services. Complaint: Patient states" my nails have grown long and thick and become painful to walk and wear shoes" Patient has been diagnosed with DM with no foot complications. The patient presents for preventative foot care services. No changes to ROS  Podiatric Exam: Vascular: dorsalis pedis and posterior tibial pulses are palpable bilateral. Capillary return is immediate. Temperature gradient is WNL. Skin turgor WNL  Sensorium: Normal Semmes Weinstein monofilament test. Normal tactile sensation bilaterally. Nail Exam: Pt has thick disfigured discolored nails with subungual debris noted bilateral entire nail hallux through fifth toenails Ulcer Exam: There is no evidence of ulcer or pre-ulcerative changes or infection. Orthopedic Exam: Muscle tone and strength are WNL. No limitations in general ROM. No crepitus or effusions noted. Foot type and digits show no abnormalities. Bony prominences are unremarkable. Skin:  Porokeratosis sub 1,5 left and clavi second left.  Porokeratosis sub 2,5 right and clavi 3rd right.  Heeloma durum 5th toe right foot. No infection or ulcers  Diagnosis:  Onychomycosis, , Pain in right toe, pain in left toes  Treatment & Plan Procedures and Treatment: Consent by patient was obtained for treatment procedures. The patient understood the discussion of treatment and procedures well. All questions were answered thoroughly reviewed. Debridement of multiple porokeratosis  B/L No ulceration, no infection noted.  Return Visit-Office Procedure: Patient instructed to return to the office for a follow up visit 10 weeks  for continued evaluation and treatment.    Helane Gunther DPM

## 2016-12-04 ENCOUNTER — Ambulatory Visit: Payer: Medicare Other | Admitting: Podiatry

## 2016-12-08 ENCOUNTER — Ambulatory Visit (INDEPENDENT_AMBULATORY_CARE_PROVIDER_SITE_OTHER): Payer: Medicare Other | Admitting: Podiatry

## 2016-12-08 ENCOUNTER — Encounter: Payer: Self-pay | Admitting: Podiatry

## 2016-12-08 DIAGNOSIS — Q828 Other specified congenital malformations of skin: Secondary | ICD-10-CM

## 2016-12-08 DIAGNOSIS — E119 Type 2 diabetes mellitus without complications: Secondary | ICD-10-CM

## 2016-12-08 NOTE — Progress Notes (Signed)
Complaint:  Visit Type: Patient returns to my office for continued preventative foot care services. Complaint: Patient states" my calluses have grown long and thick and painful both feet." Patient has been diagnosed with DM with no foot complications. The patient presents for preventative foot care services. No changes to ROS  Podiatric Exam: Vascular: dorsalis pedis and posterior tibial pulses are palpable bilateral. Capillary return is immediate. Temperature gradient is WNL. Skin turgor WNL  Sensorium: Normal Semmes Weinstein monofilament test. Normal tactile sensation bilaterally. Nail Exam: Pt has thick disfigured discolored nails with subungual debris noted bilateral entire nail hallux through fifth toenails Ulcer Exam: There is no evidence of ulcer or pre-ulcerative changes or infection. Orthopedic Exam: Muscle tone and strength are WNL. No limitations in general ROM. No crepitus or effusions noted. Foot type and digits show no abnormalities. Bony prominences are unremarkable. Skin:  Porokeratosis sub 1,5 left and clavi second left.  Porokeratosis sub 2,5 right and clavi 3rd right.  Heeloma durum 5th toe right foot. No infection or ulcers.  Porokeratosis  Fifth metabase right foot. Diagnosis:  Onychomycosis, , Pain in right toe, pain in left toes  Treatment & Plan Procedures and Treatment: Consent by patient was obtained for treatment procedures. The patient understood the discussion of treatment and procedures well. All questions were answered thoroughly reviewed. Debridement of multiple porokeratosis  B/L No ulceration, no infection noted.  Return Visit-Office Procedure: Patient instructed to return to the office for a follow up visit 10 weeks  for continued evaluation and treatment.    Contrina Orona DPM 

## 2016-12-09 ENCOUNTER — Telehealth: Payer: Self-pay | Admitting: Podiatry

## 2016-12-09 NOTE — Telephone Encounter (Signed)
I was seen by Dr. Stacie Acres yesterday and there is some discrepancies on my paperwork. He did not trim my toenails, only my corns and calluses. Also, it should show [employer] as my secondary insurance.

## 2017-02-16 ENCOUNTER — Encounter: Payer: Self-pay | Admitting: Podiatry

## 2017-02-16 ENCOUNTER — Ambulatory Visit (INDEPENDENT_AMBULATORY_CARE_PROVIDER_SITE_OTHER): Payer: Medicare Other | Admitting: Podiatry

## 2017-02-16 DIAGNOSIS — M21619 Bunion of unspecified foot: Secondary | ICD-10-CM

## 2017-02-16 DIAGNOSIS — Q828 Other specified congenital malformations of skin: Secondary | ICD-10-CM | POA: Diagnosis not present

## 2017-02-16 DIAGNOSIS — R29898 Other symptoms and signs involving the musculoskeletal system: Secondary | ICD-10-CM

## 2017-02-16 DIAGNOSIS — L909 Atrophic disorder of skin, unspecified: Secondary | ICD-10-CM

## 2017-02-16 DIAGNOSIS — E119 Type 2 diabetes mellitus without complications: Secondary | ICD-10-CM | POA: Diagnosis not present

## 2017-02-16 NOTE — Progress Notes (Signed)
Complaint:  Visit Type: Patient returns to my office for continued preventative foot care services. Complaint: Patient states" my calluses have grown long and thick and painful both feet." Patient has been diagnosed with DM with no foot complications. The patient presents for preventative foot care services. No changes to ROS  Podiatric Exam: Vascular: dorsalis pedis and posterior tibial pulses are palpable bilateral. Capillary return is immediate. Temperature gradient is WNL. Skin turgor WNL  Sensorium: Normal Semmes Weinstein monofilament test. Normal tactile sensation bilaterally. Nail Exam: Pt has thick disfigured discolored nails with subungual debris noted bilateral entire nail hallux through fifth toenails Ulcer Exam: There is no evidence of ulcer or pre-ulcerative changes or infection. Orthopedic Exam: Muscle tone and strength are WNL. No limitations in general ROM. No crepitus or effusions noted. Foot type and digits show no abnormalities. Bony prominences are unremarkable. Skin:  Porokeratosis sub 1,5 left and clavi second left.  Porokeratosis sub 2,5 right and clavi 3rd right.  Heeloma durum 5th toe right foot. No infection or ulcers.  Porokeratosis  Fifth metabase right foot. Diagnosis:  Onychomycosis, , Pain in right toe, pain in left toes  Treatment & Plan Procedures and Treatment: Consent by patient was obtained for treatment procedures. The patient understood the discussion of treatment and procedures well. All questions were answered thoroughly reviewed. Debridement of multiple porokeratosis  B/L No ulceration, no infection noted.  Return Visit-Office Procedure: Patient instructed to return to the office for a follow up visit 10 weeks  for continued evaluation and treatment.    Helane Gunther DPM

## 2017-04-23 ENCOUNTER — Ambulatory Visit (INDEPENDENT_AMBULATORY_CARE_PROVIDER_SITE_OTHER): Payer: Medicare Other | Admitting: Podiatry

## 2017-04-23 ENCOUNTER — Encounter: Payer: Self-pay | Admitting: Podiatry

## 2017-04-23 DIAGNOSIS — E119 Type 2 diabetes mellitus without complications: Secondary | ICD-10-CM

## 2017-04-23 DIAGNOSIS — Q828 Other specified congenital malformations of skin: Secondary | ICD-10-CM | POA: Diagnosis not present

## 2017-04-23 NOTE — Progress Notes (Signed)
Complaint:  Visit Type: Patient returns to my office for continued preventative foot care services. Complaint: Patient states" my calluses have grown long and thick and painful both feet." Patient has been diagnosed with DM with no foot complications. The patient presents for preventative foot care services. No changes to ROS  Podiatric Exam: Vascular: dorsalis pedis and posterior tibial pulses are palpable bilateral. Capillary return is immediate. Temperature gradient is WNL. Skin turgor WNL  Sensorium: Normal Semmes Weinstein monofilament test. Normal tactile sensation bilaterally. Nail Exam: Pt has thick disfigured discolored nails with subungual debris noted bilateral entire nail hallux through fifth toenails Ulcer Exam: There is no evidence of ulcer or pre-ulcerative changes or infection. Orthopedic Exam: Muscle tone and strength are WNL. No limitations in general ROM. No crepitus or effusions noted. Foot type and digits show no abnormalities. Bony prominences are unremarkable. Skin:  Porokeratosis sub 1,5 left and clavi second left.  Porokeratosis sub 2,5 right and clavi 3rd right.  Heloma durum 5th toe right foot. No infection or ulcers.  Porokeratosis  Fifth metabase right foot. Diagnosis:  Porokeratosis  B/L  Treatment & Plan Procedures and Treatment: Consent by patient was obtained for treatment procedures. The patient understood the discussion of treatment and procedures well. All questions were answered thoroughly reviewed. Debridement of multiple porokeratosis  B/L No ulceration, no infection noted.  Return Visit-Office Procedure: Patient instructed to return to the office for a follow up visit 9 weeks  for continued evaluation and treatment.    Andy Moye DPM 

## 2017-06-25 ENCOUNTER — Encounter: Payer: Self-pay | Admitting: Podiatry

## 2017-06-25 ENCOUNTER — Ambulatory Visit (INDEPENDENT_AMBULATORY_CARE_PROVIDER_SITE_OTHER): Payer: Medicare Other | Admitting: Podiatry

## 2017-06-25 DIAGNOSIS — E119 Type 2 diabetes mellitus without complications: Secondary | ICD-10-CM

## 2017-06-25 DIAGNOSIS — Q828 Other specified congenital malformations of skin: Secondary | ICD-10-CM | POA: Diagnosis not present

## 2017-06-25 DIAGNOSIS — L909 Atrophic disorder of skin, unspecified: Secondary | ICD-10-CM

## 2017-06-25 DIAGNOSIS — R29898 Other symptoms and signs involving the musculoskeletal system: Secondary | ICD-10-CM

## 2017-06-25 NOTE — Progress Notes (Signed)
Complaint:  Visit Type: Patient returns to my office for continued preventative foot care services. Complaint: Patient states" my calluses have grown long and thick and painful both feet." Patient has been diagnosed with DM with no foot complications. The patient presents for preventative foot care services. No changes to ROS  Podiatric Exam: Vascular: dorsalis pedis and posterior tibial pulses are palpable bilateral. Capillary return is immediate. Temperature gradient is WNL. Skin turgor WNL  Sensorium: Normal Semmes Weinstein monofilament test. Normal tactile sensation bilaterally. Nail Exam: Pt has thick disfigured discolored nails with subungual debris noted bilateral entire nail hallux through fifth toenails Ulcer Exam: There is no evidence of ulcer or pre-ulcerative changes or infection. Orthopedic Exam: Muscle tone and strength are WNL. No limitations in general ROM. No crepitus or effusions noted. Foot type and digits show no abnormalities. Bony prominences are unremarkable. Skin:  Porokeratosis sub 1,5 left and clavi second left.  Porokeratosis sub 2,5 right and clavi 3rd right.  Heloma durum 5th toe right foot. No infection or ulcers.  Porokeratosis  Fifth metabase right foot. Diagnosis:  Porokeratosis  B/L  Treatment & Plan Procedures and Treatment: Consent by patient was obtained for treatment procedures. The patient understood the discussion of treatment and procedures well. All questions were answered thoroughly reviewed. Debridement of multiple porokeratosis  B/L No ulceration, no infection noted.  Return Visit-Office Procedure: Patient instructed to return to the office for a follow up visit 9 weeks  for continued evaluation and treatment.    Valiant Dills DPM 

## 2017-06-29 ENCOUNTER — Other Ambulatory Visit: Payer: Self-pay

## 2017-06-29 ENCOUNTER — Encounter: Payer: Self-pay | Admitting: Pharmacist

## 2017-06-29 ENCOUNTER — Ambulatory Visit: Payer: Self-pay | Admitting: Pharmacist

## 2017-06-29 VITALS — BP 140/60 | Wt 153.0 lb

## 2017-06-29 DIAGNOSIS — Z79899 Other long term (current) drug therapy: Secondary | ICD-10-CM

## 2017-06-29 NOTE — Progress Notes (Signed)
Medication Management Clinic Visit Note  Patient: Tracy Acevedo MRN: [id number] Date of Birth: 08-28-1925 PCP: Marguarite Arbour, MD   [name] 84 y.o. female presents for a  Medication therapy management visit with the pharmacist. She continues to experience cramping in her hands and feet.   BP 140/60   Wt 153 lb (69.4 kg) Comment: at MD office 05/12/17  BMI 25.46 kg/m   Patient Information   Past Medical History:  Diagnosis Date  . AF (atrial fibrillation) (HCC)   . Anemia   . Arthritis, rheumatoid (HCC)   . Asthma   . Bruises easily   . Diabetes (HCC)   . Hearing loss   . History of swelling of feet   . Memory loss   . Muscle cramp   . Muscle pain   . Osteoporosis       Past Surgical History:  Procedure Laterality Date  . COLONOSCOPY    . EYE SURGERY Right   . GALLBLADDER SURGERY    . HIP SURGERY Bilateral   . KNEE SURGERY Right     No family history on file.  New Diagnoses (since last visit):   Family Support: Good  Lifestyle Diet:           Social History   Substance and Sexual Activity  Alcohol Use Yes   Comment: social      Social History   Tobacco Use  Smoking Status Former Smoker  Smokeless Tobacco Never Used      Health Maintenance  Topic Date Due  . HEMOGLOBIN A1C  Oct 20, 1927  . FOOT EXAM  11/09/1937  . OPHTHALMOLOGY EXAM  11/09/1937  . TETANUS/TDAP  11/10/1946  . DEXA SCAN  11/09/1992  . PNA vac Low Risk Adult (1 of 2 - PCV13) 11/09/1992  . INFLUENZA VACCINE  10/29/2017    Outpatient Encounter Medications as of 06/29/2017  Medication Sig  . albuterol (VENTOLIN HFA) 108 (90 BASE) MCG/ACT inhaler Inhale 1 puff into the lungs every 6 (six) hours as needed for wheezing or shortness of breath.  Marland Kitchen BIOTIN PO Take by mouth daily. Dose unknown  . Calcium Carbonate-Vitamin D (CALTRATE 600+D PO) Take 1 tablet by mouth daily.  . cetirizine (ZYRTEC) 10 MG tablet Take 10 mg by mouth daily.  . dabigatran (PRADAXA) 150 MG CAPS  capsule Take 150 mg by mouth 2 (two) times daily.  . digoxin (LANOXIN) 0.125 MG tablet Take 0.125 mg by mouth daily.  Marland Kitchen diltiazem (TIAZAC) 360 MG 24 hr capsule Take 360 mg by mouth daily.  Marland Kitchen EPINEPHrine (EPIPEN) 0.3 mg/0.3 mL SOAJ injection Inject 0.3 mg into the muscle once.  Marland Kitchen esomeprazole (NEXIUM) 40 MG capsule Take 40 mg by mouth daily at 12 noon.  . Eszopiclone (ESZOPICLONE) 3 MG TABS Take 3 mg by mouth at bedtime. Take immediately before bedtime  . Fluticasone-Salmeterol (ADVAIR) 100-50 MCG/DOSE AEPB Inhale 1 puff into the lungs 2 (two) times daily. Advair  . losartan-hydrochlorothiazide (HYZAAR) 100-12.5 MG tablet Take 1 tablet by mouth daily.  . Methylsulfonylmethane (MSM PO) Take 1 tablet by mouth daily. MSM Complex  . montelukast (SINGULAIR) 10 MG tablet Take 10 mg by mouth at bedtime.  . Multiple Vitamins-Minerals (MULTIVITAMIN ADULT PO) Take 1 tablet by mouth daily.  . nebivolol (BYSTOLIC) 5 MG tablet Take 5 mg by mouth daily.  . Probiotic Product (PROBIOTIC DAILY PO) Take 1 capsule by mouth daily.  . S-Adenosylmethionine (SAM-E PO) Take 1 tablet by mouth daily.  . Triamcinolone Acetonide (NASACORT  AQ NA) Place into the nose daily.  . [DISCONTINUED] Fluticasone-Salmeterol (ADVAIR) 250-50 MCG/DOSE AEPB Inhale 1 puff into the lungs 2 (two) times daily.  . [DISCONTINUED] MELATONIN PO Take 1 capsule by mouth daily.   No facility-administered encounter medications on file as of 06/29/2017.     Assessment and Plan: Compliance:  Medications were reviewed. She is compliant with her medications and uses a pill box.  Health Care:  She is followed by several specialists: cardiologist, pulmonologist, gastroenterologist, orthopedist, ENT, dermatologist and podiatrist in addition to her primary care physician. Last visit with Dr. Judithann Sheen was on 06/23/17. Cardiac:  Currently on losartan-HCTZ, nebivolol, digoxin, diltiazem and dabigatran. Last digoxin level was 1.5 ng/ml on 08/15/15. Digoxin dose  was decreased from 0.25 mg to 0.125 mg around September 2017.  Patient denies bleeding and has minimal bruising.  Last visit with Dr. Lady Gary was on 05/12/17. Cholesterol:  TC = 188 mg/dl; TG = 68 mg/dl; HDL = 13.2 mg/dl; LDL = 440 mg/dl on 01/25/24.  Asthma/COPD/Allergies:  No issues. Currently on Albuterol, Advair, cetirizine and montelukast. Last visit with Dr. Meredeth Ide was on 02/12/17. Labs:  TSH = 1.562; T4 = 7.8 (06/18/17); A1c = 6.1 mg/dl (3/66/44) General:  Uses the Medical Alert system in the event that she falls. She had two falls last year, last fall occurred 02/2017.  Patient has decided that she no longer wants to exercise. She has a friend that assists with her groceries.  Most of her medications are purchased through mail order.  Once per [facility].  RTC in 1 year.  Satonya Lux K. Joelene Millin, PharmD [facility] Clinic-Pharmacy Operations Coordinator [phone]

## 2017-06-29 NOTE — Progress Notes (Signed)
Medication Management Clinic Visit Note  Patient: Tracy Acevedo MRN: [id number] Date of Birth: July 04, 1925 PCP: Tracy Arbour, MD   Tracy Acevedo 84 y.o. female presents for a  Medication therapy management visit with the pharmacist. She continues to experience cramping in her hands and feet.   BP 140/60 (BP Location: Left Arm, Patient Position: Sitting, Cuff Size: Large)   Wt 153 lb (69.4 kg) Comment: at MD office 05/12/17  BMI 25.46 kg/m   Patient Information   Past Medical History:  Diagnosis Date  . AF (atrial fibrillation) (HCC)   . Anemia   . Arthritis, rheumatoid (HCC)   . Asthma   . Bruises easily   . Diabetes (HCC)   . Hearing loss   . History of swelling of feet   . Memory loss   . Muscle cramp   . Muscle pain   . Osteoporosis       Past Surgical History:  Procedure Laterality Date  . COLONOSCOPY    . EYE SURGERY Right   . GALLBLADDER SURGERY    . HIP SURGERY Bilateral   . KNEE SURGERY Right     History reviewed. No pertinent family history.  New Diagnoses (since last visit):   Family Support: Good  Lifestyle Diet:           Social History   Substance and Sexual Activity  Alcohol Use Yes   Comment: social      Social History   Tobacco Use  Smoking Status Former Smoker  Smokeless Tobacco Never Used      Health Maintenance  Topic Date Due  . HEMOGLOBIN A1C  08/28/27  . FOOT EXAM  11/09/1937  . OPHTHALMOLOGY EXAM  11/09/1937  . TETANUS/TDAP  11/10/1946  . DEXA SCAN  11/09/1992  . PNA vac Low Risk Adult (1 of 2 - PCV13) 11/09/1992  . INFLUENZA VACCINE  10/29/2017    Outpatient Encounter Medications as of 06/29/2017  Medication Sig  . albuterol (VENTOLIN HFA) 108 (90 BASE) MCG/ACT inhaler Inhale 1 puff into the lungs every 6 (six) hours as needed for wheezing or shortness of breath.  Marland Kitchen BIOTIN PO Take by mouth daily. Dose unknown  . Calcium Carbonate-Vitamin D (CALTRATE 600+D PO) Take 1 tablet by mouth daily.  .  cetirizine (ZYRTEC) 10 MG tablet Take 10 mg by mouth daily.  . dabigatran (PRADAXA) 150 MG CAPS capsule Take 150 mg by mouth 2 (two) times daily.  . digoxin (LANOXIN) 0.125 MG tablet Take 0.125 mg by mouth daily.  Marland Kitchen diltiazem (TIAZAC) 360 MG 24 hr capsule Take 360 mg by mouth daily.  Marland Kitchen EPINEPHrine (EPIPEN) 0.3 mg/0.3 mL SOAJ injection Inject 0.3 mg into the muscle once.  Marland Kitchen esomeprazole (NEXIUM) 40 MG capsule Take 40 mg by mouth daily at 12 noon.  . Eszopiclone (ESZOPICLONE) 3 MG TABS Take 3 mg by mouth at bedtime. Take immediately before bedtime  . Fluticasone-Salmeterol (ADVAIR) 100-50 MCG/DOSE AEPB Inhale 1 puff into the lungs 2 (two) times daily. Advair  . losartan-hydrochlorothiazide (HYZAAR) 100-12.5 MG tablet Take 1 tablet by mouth daily.  . Methylsulfonylmethane (MSM PO) Take 1 tablet by mouth daily. MSM Complex  . montelukast (SINGULAIR) 10 MG tablet Take 10 mg by mouth at bedtime.  . Multiple Vitamins-Minerals (MULTIVITAMIN ADULT PO) Take 1 tablet by mouth daily.  . nebivolol (BYSTOLIC) 5 MG tablet Take 5 mg by mouth daily.  . Probiotic Product (PROBIOTIC DAILY PO) Take 1 capsule by mouth daily.  . S-Adenosylmethionine (SAM-E PO)  Take 1 tablet by mouth daily.  . Triamcinolone Acetonide (NASACORT AQ NA) Place into the nose daily.  . [DISCONTINUED] Fluticasone-Salmeterol (ADVAIR) 250-50 MCG/DOSE AEPB Inhale 1 puff into the lungs 2 (two) times daily.  . [DISCONTINUED] MELATONIN PO Take 1 capsule by mouth daily.   No facility-administered encounter medications on file as of 06/29/2017.     Assessment and Plan: Compliance:  Medications were reviewed. She is compliant with her medications and uses a pill box.  Health Care:  She is followed by several specialists: cardiologist, pulmonologist, gastroenterologist, orthopedist, ENT, dermatologist and podiatrist in addition to her primary care physician. Last visit with Dr. Judithann Acevedo was on 06/23/17. Cardiac:  Currently on losartan-HCTZ,  nebivolol, digoxin, diltiazem and dabigatran. Last digoxin level was 1.5 ng/ml on 08/15/15. Digoxin dose was decreased from 0.25 mg to 0.125 mg around September 2017.  Patient denies bleeding and has minimal bruising.  Last visit with Dr. Lady Acevedo was on 05/12/17. Cholesterol:  TC = 188 mg/dl; TG = 68 mg/dl; HDL = 03.7 mg/dl; LDL = 048 mg/dl on 88/91/69.  Asthma/COPD/Allergies:  No issues. Currently on Albuterol, Advair, cetirizine and montelukast. Last visit with Dr. Meredeth Acevedo was on 02/12/17. Labs:  TSH = 1.562; T4 = 7.8 (06/18/17); A1c = 6.1 mg/dl (4/50/38) General:  Uses the Medical Alert system in the event that she falls. She had two falls last year, last fall occurred 02/2017.  Patient has decided that she no longer wants to exercise. She has a friend that assists with her groceries.  Most of her medications are purchased through mail order.  Once per week she volunteers with [facility].  RTC in 1 year.  Tracy Acevedo, PharmD [facility] Clinic-Pharmacy Operations Coordinator [phone]

## 2017-08-31 ENCOUNTER — Encounter: Payer: Self-pay | Admitting: Podiatry

## 2017-08-31 ENCOUNTER — Ambulatory Visit (INDEPENDENT_AMBULATORY_CARE_PROVIDER_SITE_OTHER): Payer: Medicare Other | Admitting: Podiatry

## 2017-08-31 DIAGNOSIS — R29898 Other symptoms and signs involving the musculoskeletal system: Secondary | ICD-10-CM

## 2017-08-31 DIAGNOSIS — E119 Type 2 diabetes mellitus without complications: Secondary | ICD-10-CM

## 2017-08-31 DIAGNOSIS — Q828 Other specified congenital malformations of skin: Secondary | ICD-10-CM

## 2017-08-31 DIAGNOSIS — L909 Atrophic disorder of skin, unspecified: Secondary | ICD-10-CM

## 2017-08-31 NOTE — Progress Notes (Signed)
Complaint:  Visit Type: Patient returns to my office for continued preventative foot care services. Complaint: Patient states" my calluses have grown long and thick and painful both feet." Patient has been diagnosed with DM with no foot complications. The patient presents for preventative foot care services. No changes to ROS  Podiatric Exam: Vascular: dorsalis pedis and posterior tibial pulses are palpable bilateral. Capillary return is immediate. Temperature gradient is WNL. Skin turgor WNL  Sensorium: Normal Semmes Weinstein monofilament test. Normal tactile sensation bilaterally. Nail Exam: Pt has thick disfigured discolored nails with subungual debris noted bilateral entire nail hallux through fifth toenails Ulcer Exam: There is no evidence of ulcer or pre-ulcerative changes or infection. Orthopedic Exam: Muscle tone and strength are WNL. No limitations in general ROM. No crepitus or effusions noted. Foot type and digits show no abnormalities. Bony prominences are unremarkable. Skin:  Porokeratosis sub 1,5 left and clavi second left.  Porokeratosis sub 2,5 right and clavi 3rd right.  Heloma durum 5th toe right foot. No infection or ulcers.  Porokeratosis  Fifth metabase right foot. Diagnosis:  Porokeratosis  B/L  Treatment & Plan Procedures and Treatment: Consent by patient was obtained for treatment procedures. The patient understood the discussion of treatment and procedures well. All questions were answered thoroughly reviewed. Debridement of multiple porokeratosis  B/L No ulceration, no infection noted.  Return Visit-Office Procedure: Patient instructed to return to the office for a follow up visit 9 weeks  for continued evaluation and treatment.    [name] DPM 

## 2017-11-09 ENCOUNTER — Ambulatory Visit (INDEPENDENT_AMBULATORY_CARE_PROVIDER_SITE_OTHER): Payer: Medicare Other | Admitting: Podiatry

## 2017-11-09 ENCOUNTER — Encounter: Payer: Self-pay | Admitting: Podiatry

## 2017-11-09 DIAGNOSIS — L909 Atrophic disorder of skin, unspecified: Secondary | ICD-10-CM

## 2017-11-09 DIAGNOSIS — E119 Type 2 diabetes mellitus without complications: Secondary | ICD-10-CM

## 2017-11-09 DIAGNOSIS — Q828 Other specified congenital malformations of skin: Secondary | ICD-10-CM | POA: Diagnosis not present

## 2017-11-09 DIAGNOSIS — R29898 Other symptoms and signs involving the musculoskeletal system: Secondary | ICD-10-CM

## 2017-11-09 NOTE — Progress Notes (Signed)
Complaint:  Visit Type: Patient returns to my office for continued preventative foot care services. Complaint: Patient states" my calluses have grown long and thick and painful both feet." Patient has been diagnosed with DM with no foot complications. The patient presents for preventative foot care services. No changes to ROS  Podiatric Exam: Vascular: dorsalis pedis and posterior tibial pulses are palpable bilateral. Capillary return is immediate. Temperature gradient is WNL. Skin turgor WNL  Sensorium: Normal Semmes Weinstein monofilament test. Normal tactile sensation bilaterally. Nail Exam: Pt has thick disfigured discolored nails with subungual debris noted bilateral entire nail hallux through fifth toenails Ulcer Exam: There is no evidence of ulcer or pre-ulcerative changes or infection. Orthopedic Exam: Muscle tone and strength are WNL. No limitations in general ROM. No crepitus or effusions noted. Foot type and digits show no abnormalities. Bony prominences are unremarkable. Skin:  Porokeratosis sub 1,5 left and clavi second left.  Porokeratosis sub 2,5 right and clavi 3rd right.  Heloma durum 5th toe right foot. No infection or ulcers.  Porokeratosis  Fifth metabase right foot. Diagnosis:  Porokeratosis  B/L  Treatment & Plan Procedures and Treatment: Consent by patient was obtained for treatment procedures. The patient understood the discussion of treatment and procedures well. All questions were answered thoroughly reviewed. Debridement of multiple porokeratosis  B/L No ulceration, no infection noted.  Return Visit-Office Procedure: Patient instructed to return to the office for a follow up visit 9 weeks  for continued evaluation and treatment.    Ariba Lehnen DPM 

## 2017-12-03 ENCOUNTER — Ambulatory Visit: Payer: Medicare Other | Admitting: Podiatry

## 2018-01-21 ENCOUNTER — Ambulatory Visit (INDEPENDENT_AMBULATORY_CARE_PROVIDER_SITE_OTHER): Payer: Medicare Other | Admitting: Podiatry

## 2018-01-21 ENCOUNTER — Encounter: Payer: Self-pay | Admitting: Podiatry

## 2018-01-21 DIAGNOSIS — M21619 Bunion of unspecified foot: Secondary | ICD-10-CM | POA: Diagnosis not present

## 2018-01-21 DIAGNOSIS — E119 Type 2 diabetes mellitus without complications: Secondary | ICD-10-CM

## 2018-01-21 DIAGNOSIS — Q828 Other specified congenital malformations of skin: Secondary | ICD-10-CM

## 2018-01-21 NOTE — Progress Notes (Signed)
Complaint:  Visit Type: Patient returns to my office for continued preventative foot care services. Complaint: Patient states" my calluses have grown long and thick and painful both feet." Patient has been diagnosed with DM with no foot complications. The patient presents for preventative foot care services. No changes to ROS  Podiatric Exam: Vascular: dorsalis pedis and posterior tibial pulses are palpable bilateral. Capillary return is immediate. Temperature gradient is WNL. Skin turgor WNL  Sensorium: Normal Semmes Weinstein monofilament test. Normal tactile sensation bilaterally. Nail Exam: Pt has thick disfigured discolored nails with subungual debris noted bilateral entire nail hallux through fifth toenails Ulcer Exam: There is no evidence of ulcer or pre-ulcerative changes or infection. Orthopedic Exam: Muscle tone and strength are WNL. No limitations in general ROM. No crepitus or effusions noted. Foot type and digits show no abnormalities. Bony prominences are unremarkable. Skin:  Porokeratosis sub 1,5 left and clavi second left.  Porokeratosis sub 2,5 right and clavi 3rd right.  Heloma durum 5th toe right foot. No infection or ulcers.  Porokeratosis  Fifth metabase right foot. Diagnosis:  Porokeratosis  B/L  Treatment & Plan Procedures and Treatment: Consent by patient was obtained for treatment procedures. The patient understood the discussion of treatment and procedures well. All questions were answered thoroughly reviewed. Debridement of multiple porokeratosis  B/L No ulceration, no infection noted.  Return Visit-Office Procedure: Patient instructed to return to the office for a follow up visit 9 weeks  for continued evaluation and treatment.    Helane Gunther DPM

## 2018-04-01 ENCOUNTER — Ambulatory Visit: Payer: Medicare Other | Admitting: Podiatry

## 2018-04-05 ENCOUNTER — Encounter: Payer: Self-pay | Admitting: Podiatry

## 2018-04-05 ENCOUNTER — Ambulatory Visit (INDEPENDENT_AMBULATORY_CARE_PROVIDER_SITE_OTHER): Payer: Medicare Other | Admitting: Podiatry

## 2018-04-05 DIAGNOSIS — Q828 Other specified congenital malformations of skin: Secondary | ICD-10-CM | POA: Diagnosis not present

## 2018-04-05 DIAGNOSIS — E119 Type 2 diabetes mellitus without complications: Secondary | ICD-10-CM | POA: Diagnosis not present

## 2018-04-05 NOTE — Progress Notes (Signed)
Complaint:  Visit Type: Patient returns to my office for continued preventative foot care services. Complaint: Patient states" my calluses have grown long and thick and painful both feet." Patient has been diagnosed with DM with no foot complications. The patient presents for preventative foot care services. No changes to ROS  Podiatric Exam: Vascular: dorsalis pedis and posterior tibial pulses are palpable bilateral. Capillary return is immediate. Temperature gradient is WNL. Skin turgor WNL  Sensorium: Normal Semmes Weinstein monofilament test. Normal tactile sensation bilaterally. Nail Exam: Pt has thick disfigured discolored nails with subungual debris noted bilateral entire nail hallux through fifth toenails Ulcer Exam: There is no evidence of ulcer or pre-ulcerative changes or infection. Orthopedic Exam: Muscle tone and strength are WNL. No limitations in general ROM. No crepitus or effusions noted. Foot type and digits show no abnormalities. Bony prominences are unremarkable. Skin:  Porokeratosis sub 1,5 left and clavi second left.  Porokeratosis sub 2,5 right and clavi 3rd right.  Heloma durum 5th toe right foot. No infection or ulcers.  Porokeratosis  Fifth metabase right foot. Diagnosis:  Porokeratosis  B/L  Treatment & Plan Procedures and Treatment: Consent by patient was obtained for treatment procedures. The patient understood the discussion of treatment and procedures well. All questions were answered thoroughly reviewed. Debridement of multiple porokeratosis  B/L No ulceration, no infection noted.  Return Visit-Office Procedure: Patient instructed to return to the office for a follow up visit 9 weeks  for continued evaluation and treatment.    Aaylah Pokorny DPM 

## 2018-06-07 ENCOUNTER — Ambulatory Visit (INDEPENDENT_AMBULATORY_CARE_PROVIDER_SITE_OTHER): Payer: Medicare Other | Admitting: Podiatry

## 2018-06-07 ENCOUNTER — Encounter: Payer: Self-pay | Admitting: Podiatry

## 2018-06-07 DIAGNOSIS — M21619 Bunion of unspecified foot: Secondary | ICD-10-CM

## 2018-06-07 DIAGNOSIS — E119 Type 2 diabetes mellitus without complications: Secondary | ICD-10-CM | POA: Diagnosis not present

## 2018-06-07 DIAGNOSIS — Q828 Other specified congenital malformations of skin: Secondary | ICD-10-CM | POA: Diagnosis not present

## 2018-06-07 NOTE — Progress Notes (Signed)
Complaint:  Visit Type: Patient returns to my office for continued preventative foot care services. Complaint: Patient states" my calluses have grown long and thick and painful both feet." Patient has been diagnosed with DM with no foot complications. The patient presents for preventative foot care services. No changes to ROS  Podiatric Exam: Vascular: dorsalis pedis and posterior tibial pulses are palpable bilateral. Capillary return is immediate. Temperature gradient is WNL. Skin turgor WNL  Sensorium: Normal Semmes Weinstein monofilament test. Normal tactile sensation bilaterally. Nail Exam: Pt has thick disfigured discolored nails with subungual debris noted bilateral entire nail hallux through fifth toenails Ulcer Exam: There is no evidence of ulcer or pre-ulcerative changes or infection. Orthopedic Exam: Muscle tone and strength are WNL. No limitations in general ROM. No crepitus or effusions noted. Foot type and digits show no abnormalities. Bony prominences are unremarkable. Skin:  Porokeratosis sub 1,5 left and clavi second left.  Porokeratosis sub 2,5 right and clavi 3rd right.  Heloma durum 5th toe right foot. No infection or ulcers.  Porokeratosis  Fifth metabase right foot. Diagnosis:  Porokeratosis  B/L  Treatment & Plan Procedures and Treatment: Consent by patient was obtained for treatment procedures. The patient understood the discussion of treatment and procedures well. All questions were answered thoroughly reviewed. Debridement of multiple porokeratosis  B/L No ulceration, no infection noted.  Return Visit-Office Procedure: Patient instructed to return to the office for a follow up visit 9 weeks  for continued evaluation and treatment.    Elimelech Houseman DPM 

## 2018-07-06 ENCOUNTER — Other Ambulatory Visit: Payer: Self-pay

## 2018-07-06 ENCOUNTER — Ambulatory Visit: Payer: Medicare Other | Admitting: Pharmacist

## 2018-07-06 DIAGNOSIS — Z79899 Other long term (current) drug therapy: Secondary | ICD-10-CM

## 2018-07-06 NOTE — Progress Notes (Signed)
Medication Management Clinic Visit Note  Patient: Tracy Acevedo MRN: [id number] Date of Birth: [birth year] PCP: Marguarite Arbour, MD   Tracy Acevedo 85 y.o. female presents for an outreach call today for her annual medication therapy management review. She is doing fairly well but admits to "forgetting a lot".   There were no vitals taken for this visit.  Patient Information   Past Medical History:  Diagnosis Date  . AF (atrial fibrillation) (HCC)   . Anemia   . Arthritis, rheumatoid (HCC)   . Asthma   . Bruises easily   . Diabetes (HCC)   . Hearing loss   . History of swelling of feet   . Memory loss   . Muscle cramp   . Muscle pain   . Osteoporosis       Past Surgical History:  Procedure Laterality Date  . COLONOSCOPY    . EYE SURGERY Right   . GALLBLADDER SURGERY    . HIP SURGERY Bilateral   . KNEE SURGERY Right     No family history on file.  New Diagnoses (since last visit):   Family Support: Good     Current Exercise Habits: Home exercise routine, Type of exercise: Other - see comments(Uses a machine that automatically moves her legs), Intensity: Mild  Exercise limited by: respiratory conditions(s);orthopedic condition(s)    Social History   Substance and Sexual Activity  Alcohol Use Yes   Comment: social      Social History   Tobacco Use  Smoking Status Former Smoker  Smokeless Tobacco Never Used      Health Maintenance  Topic Date Due  . HEMOGLOBIN A1C  July 05, 1925  . OPHTHALMOLOGY EXAM  11/09/1937  . TETANUS/TDAP  11/10/1946  . DEXA SCAN  11/09/1992  . PNA vac Low Risk Adult (2 of 2 - PCV13) 02/20/2016  . INFLUENZA VACCINE  10/30/2018  . FOOT EXAM  06/07/2019    Outpatient Encounter Medications as of 07/06/2018  Medication Sig  . albuterol (VENTOLIN HFA) 108 (90 BASE) MCG/ACT inhaler Inhale 1 puff into the lungs every 6 (six) hours as needed for wheezing or shortness of breath.  Marland Kitchen BIOTIN PO Take by mouth daily. Dose unknown   . Calcium Carbonate-Vitamin D (CALTRATE 600+D PO) Take 1 tablet by mouth daily.  . cetirizine (ZYRTEC) 10 MG tablet Take 10 mg by mouth daily.  . dabigatran (PRADAXA) 150 MG CAPS capsule Take 150 mg by mouth 2 (two) times daily.  . digoxin (LANOXIN) 0.125 MG tablet Take 0.125 mg by mouth daily.  Marland Kitchen diltiazem (CARDIZEM CD) 360 MG 24 hr capsule Take by mouth.  . EPINEPHrine (EPIPEN) 0.3 mg/0.3 mL SOAJ injection Inject 0.3 mg into the muscle once.  Marland Kitchen esomeprazole (NEXIUM) 40 MG capsule Take 40 mg by mouth daily at 12 noon.  . Eszopiclone (ESZOPICLONE) 3 MG TABS Take 3 mg by mouth at bedtime. Take immediately before bedtime  . Fluticasone-Salmeterol (ADVAIR) 100-50 MCG/DOSE AEPB Inhale 1 puff into the lungs 2 (two) times daily. Advair  . Glucosamine-Chondroitin-MSM 500-400-300 MG TABS Take 1 tablet by mouth daily. Not sure of complete formula. Pt recalled Glucosamine-Chondroiton and MSM.  Marland Kitchen losartan-hydrochlorothiazide (HYZAAR) 100-12.5 MG tablet Take 1 tablet by mouth daily.  . montelukast (SINGULAIR) 10 MG tablet Take 10 mg by mouth at bedtime.  Marland Kitchen NASACORT ALLERGY 24HR 55 MCG/ACT AERO nasal inhaler 2 sprays daily.  . Probiotic Product (PROBIOTIC DAILY PO) Take 1 capsule by mouth daily.  . S-Adenosylmethionine (SAM-E PO) Take 1  tablet by mouth daily.  . [DISCONTINUED] calcium carbonate (CALCIUM 600) 600 MG TABS tablet Take by mouth.  . [DISCONTINUED] hydrochlorothiazide (HYDRODIURIL) 12.5 MG tablet Take by mouth.  . [DISCONTINUED] losartan (COZAAR) 100 MG tablet Take by mouth.  . [DISCONTINUED] Methylsulfonylmethane (MSM PO) Take 1 tablet by mouth daily. MSM Complex  . [DISCONTINUED] nebivolol (BYSTOLIC) 5 MG tablet Take 5 mg by mouth daily.  . [DISCONTINUED] telmisartan-hydrochlorothiazide (MICARDIS HCT) 80-12.5 MG tablet Take by mouth.   No facility-administered encounter medications on file as of 07/06/2018.     Assessment and Plan: Compliance: Medications were reviewed. She is compliant  with her medications and uses 3 pill boxes (morning-afternoon-evening). Finding it more difficult to fill the boxes as the generic medications change so frequently. Considering assistive living.  Health Care:  She is followed by several specialists: cardiologist, pulmonologist, gastroenterologist, orthopedist, ENT, dermatologist and podiatrist in addition to her primary care physician. Last visit with Tracy Acevedo was on 03/17/18.  Cardiac: Currently on losartan-HCTZ, digoxin, diltiazem and dabigatran. Last digoxin level was 1.5 ng/ml on 08/15/15.  Last visit with Tracy Acevedo was on 12/02/17.  Cholesterol:  TC = 188 mg/dl; TG = 68 mg/dl; HDL = 35.3 mg/dl; LDL = 299 mg/dl on 24/26/83.   Asthma/COPD/Allergies:  Currently on Albuterol, Advair, cetirizine and montelukast and Nasacort. Last visit with Tracy Acevedo was on 05/04/18.  Labs:  TSH = 1.39 (05/23/18); A1c = 6.0 mg/dl (41/96/22).  General: Uses the Medical Alert system in the event that she falls. States she had a recent fall.  She has a friend that assists with her groceries.  Medications are delivered to her home.  Considering assistive living in the near future to assist with medical care and medications as well as her increased risk for falls.  Pharmacist to call patient in 3 months for a follow up assessment. Once Covid-19 is no longer an issue, she would like to come in for her MTM visit.  Tracy Acevedo K. Tracy Acevedo, PharmD [facility] Clinic-Pharmacy Operations Coordinator [phone]

## 2018-07-12 ENCOUNTER — Other Ambulatory Visit: Payer: Self-pay

## 2018-07-12 ENCOUNTER — Encounter (INDEPENDENT_AMBULATORY_CARE_PROVIDER_SITE_OTHER): Payer: Self-pay

## 2018-08-12 ENCOUNTER — Ambulatory Visit: Payer: Medicare Other | Admitting: Podiatry

## 2018-08-16 ENCOUNTER — Encounter: Payer: Self-pay | Admitting: Podiatry

## 2018-08-16 ENCOUNTER — Ambulatory Visit (INDEPENDENT_AMBULATORY_CARE_PROVIDER_SITE_OTHER): Payer: Medicare Other | Admitting: Podiatry

## 2018-08-16 ENCOUNTER — Other Ambulatory Visit: Payer: Self-pay

## 2018-08-16 VITALS — Temp 97.1°F

## 2018-08-16 DIAGNOSIS — M79675 Pain in left toe(s): Secondary | ICD-10-CM

## 2018-08-16 DIAGNOSIS — Q828 Other specified congenital malformations of skin: Secondary | ICD-10-CM

## 2018-08-16 DIAGNOSIS — M79674 Pain in right toe(s): Secondary | ICD-10-CM | POA: Diagnosis not present

## 2018-08-16 DIAGNOSIS — B351 Tinea unguium: Secondary | ICD-10-CM

## 2018-08-16 DIAGNOSIS — L84 Corns and callosities: Secondary | ICD-10-CM

## 2018-08-16 DIAGNOSIS — E119 Type 2 diabetes mellitus without complications: Secondary | ICD-10-CM

## 2018-08-16 NOTE — Progress Notes (Signed)
Complaint:  Visit Type: Patient returns to my office for continued preventative foot care services. Complaint: Patient states" my calluses have grown long and thick and painful both feet." Patient has been diagnosed with DM with no foot complications. The patient presents for preventative foot care services. No changes to ROS  Podiatric Exam: Vascular: dorsalis pedis and posterior tibial pulses are palpable bilateral. Capillary return is immediate. Temperature gradient is WNL. Skin turgor WNL  Sensorium: Normal Semmes Weinstein monofilament test. Normal tactile sensation bilaterally. Nail Exam: Pt has thick disfigured discolored nails with subungual debris noted bilateral entire nail hallux through fifth toenails Ulcer Exam: There is no evidence of ulcer or pre-ulcerative changes or infection. Orthopedic Exam: Muscle tone and strength are WNL. No limitations in general ROM. No crepitus or effusions noted. Foot type and digits show no abnormalities. Bony prominences are unremarkable. Skin:  Porokeratosis sub 1,5 left and clavi second and third  left.  Porokeratosis sub 2,5 right and clavi 3rd right.  Heloma durum 5th toe right foot. No infection or ulcers.  Porokeratosis  Fifth metabase right foot. Diagnosis:  Porokeratosis  B/L  Treatment & Plan Procedures and Treatment: Consent by patient was obtained for treatment procedures. The patient understood the discussion of treatment and procedures well. All questions were answered thoroughly reviewed. Debridement of multiple porokeratosis  B/L No ulceration, no infection noted.  Return Visit-Office Procedure: Patient instructed to return to the office for a follow up visit 9 weeks  for continued evaluation and treatment.    Helane Gunther DPM

## 2018-09-06 ENCOUNTER — Inpatient Hospital Stay
Admission: EM | Admit: 2018-09-06 | Discharge: 2018-09-09 | DRG: 682 | Disposition: A | Discharge status: Home / Self Care | Payer: Medicare Other | Attending: Internal Medicine | Admitting: Internal Medicine

## 2018-09-06 ENCOUNTER — Emergency Department: Payer: Medicare Other

## 2018-09-06 ENCOUNTER — Other Ambulatory Visit: Payer: Self-pay

## 2018-09-06 DIAGNOSIS — J431 Panlobular emphysema: Secondary | ICD-10-CM | POA: Diagnosis present

## 2018-09-06 DIAGNOSIS — Z8249 Family history of ischemic heart disease and other diseases of the circulatory system: Secondary | ICD-10-CM

## 2018-09-06 DIAGNOSIS — I482 Chronic atrial fibrillation, unspecified: Secondary | ICD-10-CM | POA: Diagnosis present

## 2018-09-06 DIAGNOSIS — M810 Age-related osteoporosis without current pathological fracture: Secondary | ICD-10-CM | POA: Diagnosis present

## 2018-09-06 DIAGNOSIS — I10 Essential (primary) hypertension: Secondary | ICD-10-CM | POA: Diagnosis present

## 2018-09-06 DIAGNOSIS — Z20828 Contact with and (suspected) exposure to other viral communicable diseases: Secondary | ICD-10-CM | POA: Diagnosis present

## 2018-09-06 DIAGNOSIS — Y95 Nosocomial condition: Secondary | ICD-10-CM | POA: Diagnosis present

## 2018-09-06 DIAGNOSIS — Z800 Family history of malignant neoplasm of digestive organs: Secondary | ICD-10-CM

## 2018-09-06 DIAGNOSIS — E119 Type 2 diabetes mellitus without complications: Secondary | ICD-10-CM | POA: Diagnosis present

## 2018-09-06 DIAGNOSIS — E7800 Pure hypercholesterolemia, unspecified: Secondary | ICD-10-CM | POA: Diagnosis present

## 2018-09-06 DIAGNOSIS — J189 Pneumonia, unspecified organism: Secondary | ICD-10-CM | POA: Diagnosis present

## 2018-09-06 DIAGNOSIS — M069 Rheumatoid arthritis, unspecified: Secondary | ICD-10-CM | POA: Diagnosis present

## 2018-09-06 DIAGNOSIS — Z7951 Long term (current) use of inhaled steroids: Secondary | ICD-10-CM

## 2018-09-06 DIAGNOSIS — H9190 Unspecified hearing loss, unspecified ear: Secondary | ICD-10-CM | POA: Diagnosis present

## 2018-09-06 DIAGNOSIS — Z880 Allergy status to penicillin: Secondary | ICD-10-CM | POA: Diagnosis not present

## 2018-09-06 DIAGNOSIS — E860 Dehydration: Secondary | ICD-10-CM | POA: Diagnosis present

## 2018-09-06 DIAGNOSIS — N179 Acute kidney failure, unspecified: Principal | ICD-10-CM | POA: Diagnosis present

## 2018-09-06 DIAGNOSIS — Z882 Allergy status to sulfonamides status: Secondary | ICD-10-CM | POA: Diagnosis not present

## 2018-09-06 DIAGNOSIS — Z803 Family history of malignant neoplasm of breast: Secondary | ICD-10-CM

## 2018-09-06 DIAGNOSIS — Z87891 Personal history of nicotine dependence: Secondary | ICD-10-CM

## 2018-09-06 DIAGNOSIS — K219 Gastro-esophageal reflux disease without esophagitis: Secondary | ICD-10-CM | POA: Diagnosis present

## 2018-09-06 DIAGNOSIS — Z885 Allergy status to narcotic agent status: Secondary | ICD-10-CM

## 2018-09-06 DIAGNOSIS — Z886 Allergy status to analgesic agent status: Secondary | ICD-10-CM

## 2018-09-06 LAB — CBC WITH DIFFERENTIAL/PLATELET
Abs Immature Granulocytes: 0.26 10*3/uL — ABNORMAL HIGH (ref 0.00–0.07)
Basophils Absolute: 0.1 10*3/uL (ref 0.0–0.1)
Basophils Relative: 1 %
Eosinophils Absolute: 0.1 10*3/uL (ref 0.0–0.5)
Eosinophils Relative: 1 %
HCT: 34.6 % — ABNORMAL LOW (ref 36.0–46.0)
Hemoglobin: 11.5 g/dL — ABNORMAL LOW (ref 12.0–15.0)
Immature Granulocytes: 2 %
Lymphocytes Relative: 12 %
Lymphs Abs: 1.5 10*3/uL (ref 0.7–4.0)
MCH: 27.6 pg (ref 26.0–34.0)
MCHC: 33.2 g/dL (ref 30.0–36.0)
MCV: 83 fL (ref 80.0–100.0)
Monocytes Absolute: 1.5 10*3/uL — ABNORMAL HIGH (ref 0.1–1.0)
Monocytes Relative: 12 %
Neutro Abs: 8.9 10*3/uL — ABNORMAL HIGH (ref 1.7–7.7)
Neutrophils Relative %: 72 %
Platelets: 257 10*3/uL (ref 150–400)
RBC: 4.17 MIL/uL (ref 3.87–5.11)
RDW: 14.8 % (ref 11.5–15.5)
WBC: 12.3 10*3/uL — ABNORMAL HIGH (ref 4.0–10.5)
nRBC: 0 % (ref 0.0–0.2)

## 2018-09-06 LAB — URINALYSIS, COMPLETE (UACMP) WITH MICROSCOPIC
Bacteria, UA: NONE SEEN
Bilirubin Urine: NEGATIVE
Glucose, UA: NEGATIVE mg/dL
Hgb urine dipstick: NEGATIVE
Ketones, ur: NEGATIVE mg/dL
Leukocytes,Ua: NEGATIVE
Nitrite: NEGATIVE
Protein, ur: NEGATIVE mg/dL
Specific Gravity, Urine: 1.009 (ref 1.005–1.030)
pH: 5 (ref 5.0–8.0)

## 2018-09-06 LAB — MRSA PCR SCREENING: MRSA by PCR: NEGATIVE

## 2018-09-06 LAB — COMPREHENSIVE METABOLIC PANEL
ALT: 40 U/L (ref 0–44)
AST: 34 U/L (ref 15–41)
Albumin: 2.9 g/dL — ABNORMAL LOW (ref 3.5–5.0)
Alkaline Phosphatase: 116 U/L (ref 38–126)
Anion gap: 14 (ref 5–15)
BUN: 67 mg/dL — ABNORMAL HIGH (ref 8–23)
CO2: 21 mmol/L — ABNORMAL LOW (ref 22–32)
Calcium: 9.4 mg/dL (ref 8.9–10.3)
Chloride: 102 mmol/L (ref 98–111)
Creatinine, Ser: 2.7 mg/dL — ABNORMAL HIGH (ref 0.44–1.00)
GFR calc Af Amer: 17 mL/min — ABNORMAL LOW (ref >60)
GFR calc non Af Amer: 15 mL/min — ABNORMAL LOW (ref >60)
Glucose, Bld: 124 mg/dL — ABNORMAL HIGH (ref 70–99)
Potassium: 4 mmol/L (ref 3.5–5.1)
Sodium: 137 mmol/L (ref 135–145)
Total Bilirubin: 1.1 mg/dL (ref 0.3–1.2)
Total Protein: 7.4 g/dL (ref 6.5–8.1)

## 2018-09-06 LAB — SARS CORONAVIRUS 2 BY RT PCR (HOSPITAL ORDER, PERFORMED IN ~~LOC~~ HOSPITAL LAB): SARS Coronavirus 2: NEGATIVE

## 2018-09-06 LAB — LIPASE, BLOOD: Lipase: 25 U/L (ref 11–51)

## 2018-09-06 LAB — GLUCOSE, CAPILLARY: Glucose-Capillary: 84 mg/dL (ref 70–99)

## 2018-09-06 MED ORDER — LORATADINE 10 MG PO TABS
10.0000 mg | ORAL_TABLET | Freq: Every day | ORAL | Status: DC
  Administered 2018-09-07 – 2018-09-09 (×3): 10 mg via ORAL
  Filled 2018-09-06 (×3): qty 1

## 2018-09-06 MED ORDER — SODIUM CHLORIDE 0.9 % IV SOLN: 500.0000 mg | INTRAVENOUS | Status: DC

## 2018-09-06 MED ORDER — FLUTICASONE PROPIONATE 50 MCG/ACT NA SUSP
2.0000 | Freq: Every day | NASAL | Status: DC | PRN
  Filled 2018-09-06: qty 16

## 2018-09-06 MED ORDER — INSULIN ASPART 100 UNIT/ML ~~LOC~~ SOLN: 0.0000 [IU] | Freq: Three times a day (TID) | SUBCUTANEOUS | Status: DC

## 2018-09-06 MED ORDER — SODIUM CHLORIDE 0.9 % IV SOLN
1.0000 g | Freq: Two times a day (BID) | INTRAVENOUS | Status: DC
  Administered 2018-09-07: 1 g via INTRAVENOUS
  Filled 2018-09-06 (×2): qty 1

## 2018-09-06 MED ORDER — SODIUM CHLORIDE 0.9 % IV SOLN
Freq: Once | INTRAVENOUS | Status: AC
  Administered 2018-09-06: 19:00:00 via INTRAVENOUS

## 2018-09-06 MED ORDER — DILTIAZEM HCL ER COATED BEADS 120 MG PO CP24
360.0000 mg | ORAL_CAPSULE | Freq: Every day | ORAL | Status: DC
  Administered 2018-09-07 – 2018-09-09 (×3): 360 mg via ORAL
  Filled 2018-09-06 (×3): qty 3

## 2018-09-06 MED ORDER — DIGOXIN 125 MCG PO TABS: 0.1250 mg | ORAL_TABLET | Freq: Every day | ORAL | Status: DC

## 2018-09-06 MED ORDER — PANTOPRAZOLE SODIUM 40 MG PO TBEC
40.0000 mg | DELAYED_RELEASE_TABLET | Freq: Every day | ORAL | Status: DC
  Administered 2018-09-07 – 2018-09-09 (×3): 40 mg via ORAL
  Filled 2018-09-06 (×3): qty 1

## 2018-09-06 MED ORDER — INSULIN ASPART 100 UNIT/ML ~~LOC~~ SOLN: 0.0000 [IU] | Freq: Every day | SUBCUTANEOUS | Status: DC

## 2018-09-06 MED ORDER — IPRATROPIUM-ALBUTEROL 0.5-2.5 (3) MG/3ML IN SOLN
3.0000 mL | RESPIRATORY_TRACT | Status: DC | PRN
  Administered 2018-09-08 – 2018-09-09 (×2): 3 mL via RESPIRATORY_TRACT
  Filled 2018-09-06 (×2): qty 3

## 2018-09-06 MED ORDER — ONDANSETRON HCL 4 MG/2ML IJ SOLN: 4.0000 mg | Freq: Four times a day (QID) | INTRAMUSCULAR | Status: DC | PRN

## 2018-09-06 MED ORDER — SODIUM CHLORIDE 0.9 % IV SOLN
1.0000 g | Freq: Three times a day (TID) | INTRAVENOUS | Status: DC
  Administered 2018-09-06: 1 g via INTRAVENOUS
  Filled 2018-09-06 (×3): qty 1

## 2018-09-06 MED ORDER — DABIGATRAN ETEXILATE MESYLATE 150 MG PO CAPS
150.0000 mg | ORAL_CAPSULE | Freq: Two times a day (BID) | ORAL | Status: DC
  Administered 2018-09-06 – 2018-09-07 (×2): 150 mg via ORAL
  Filled 2018-09-06 (×3): qty 1

## 2018-09-06 MED ORDER — RISAQUAD PO CAPS
1.0000 | ORAL_CAPSULE | Freq: Every day | ORAL | Status: DC
  Administered 2018-09-07 – 2018-09-09 (×3): 1 via ORAL
  Filled 2018-09-06 (×3): qty 1

## 2018-09-06 MED ORDER — SODIUM CHLORIDE 0.9 % IV SOLN
INTRAVENOUS | Status: DC
  Administered 2018-09-06 – 2018-09-07 (×2): via INTRAVENOUS

## 2018-09-06 MED ORDER — VANCOMYCIN HCL 1.5 G IV SOLR
1500.0000 mg | Freq: Once | INTRAVENOUS | Status: AC
  Administered 2018-09-06: 1500 mg via INTRAVENOUS
  Filled 2018-09-06: qty 1500

## 2018-09-06 MED ORDER — SODIUM CHLORIDE 0.9 % IV SOLN
2.0000 g | INTRAVENOUS | Status: DC
  Administered 2018-09-06: 2 g via INTRAVENOUS
  Filled 2018-09-06: qty 20

## 2018-09-06 MED ORDER — TRIAMCINOLONE ACETONIDE 55 MCG/ACT NA AERO: 2.0000 | INHALATION_SPRAY | Freq: Every day | NASAL | Status: DC

## 2018-09-06 MED ORDER — TRAZODONE HCL 50 MG PO TABS
50.0000 mg | ORAL_TABLET | Freq: Every day | ORAL | Status: DC
  Administered 2018-09-06 – 2018-09-08 (×3): 50 mg via ORAL
  Filled 2018-09-06 (×3): qty 1

## 2018-09-06 MED ORDER — MOMETASONE FURO-FORMOTEROL FUM 100-5 MCG/ACT IN AERO
2.0000 | INHALATION_SPRAY | Freq: Two times a day (BID) | RESPIRATORY_TRACT | Status: DC
  Administered 2018-09-06 – 2018-09-09 (×6): 2 via RESPIRATORY_TRACT
  Filled 2018-09-06: qty 8.8

## 2018-09-06 MED ORDER — ALBUTEROL SULFATE (2.5 MG/3ML) 0.083% IN NEBU: 2.5000 mg | INHALATION_SOLUTION | Freq: Four times a day (QID) | RESPIRATORY_TRACT | Status: DC | PRN

## 2018-09-06 MED ORDER — SODIUM CHLORIDE 0.9 % IV BOLUS
500.0000 mL | Freq: Once | INTRAVENOUS | Status: AC
  Administered 2018-09-06: 500 mL via INTRAVENOUS

## 2018-09-06 MED ORDER — VANCOMYCIN HCL 10 G IV SOLR
1500.0000 mg | Freq: Once | INTRAVENOUS | Status: DC
  Filled 2018-09-06 (×2): qty 1500

## 2018-09-06 MED ORDER — MONTELUKAST SODIUM 10 MG PO TABS
10.0000 mg | ORAL_TABLET | Freq: Every day | ORAL | Status: DC
  Administered 2018-09-06 – 2018-09-08 (×3): 10 mg via ORAL
  Filled 2018-09-06 (×3): qty 1

## 2018-09-06 NOTE — Progress Notes (Signed)
Family Meeting Note  Advance Directive:yes  Today a meeting took place with the Patient.  Patient is able to participate   The following clinical team members were present during this meeting:MD  The following were discussed:Patient's diagnosis:85 y.o. female with a known history per below was seen in congenital clinic today and had blood work done routinely, was sent back to her independent living facility, primary care provider then instructed the patient to go to the emergency room due to acute renal failure on chemistries, patient also with complaints of chronic shortness of breath, had episode of nausea, emesis, and emergency room patient was found to have pneumonia on chest x-ray, white count 12,000, creatinine 2.7 with baseline unknown, BUN 67, hospitalist asked to admit, patient in no apparent distress, resting comfortably in bed, patient now been admitted for acute H CAP, acute kidney injury, Patient's progosis: Unable to determine and Goals for treatment: Full Code  Additional follow-up to be provided: prn  Time spent during discussion:20 minutes  Gorden Harms, MD

## 2018-09-06 NOTE — Consult Note (Signed)
  Pharmacy Antibiotic Note  Tracy Acevedo is a 85 y.o. female admitted on 09/06/2018 with pneumonia.  Pharmacy has been consulted for vancomycin dosing.  Plan: Pt is possible AKI. Will give a loading of dose of vancomycin 1500 mg x 1. Will order a 24 hour level and re-assess therapy. MRSA PCR ordered. If negative recommend to d/c vancomycin.   Height: 5\' 5"  (165.1 cm) Weight: 144 lb (65.3 kg) IBW/kg (Calculated) : 57  Temp (24hrs), Avg:98.1 F (36.7 C), Min:98.1 F (36.7 C), Max:98.1 F (36.7 C)  Recent Labs  Lab 09/06/18 1543  WBC 12.3*  CREATININE 2.70*    Estimated Creatinine Clearance: 12.5 mL/min (A) (by C-G formula based on SCr of 2.7 mg/dL (H)).    Allergies  Allergen Reactions  . Aspirin Nausea And Vomiting and Nausea Only  . Penicillins Nausea And Vomiting, Hives and Itching  . Sulfa Antibiotics Nausea And Vomiting, Hives and Itching  . Tramadol Itching and Hives    hives hives hives  . Tramadol Hcl     Antimicrobials this admission: 6/8 ceftriaxone >>  6/8 azithromycin >>   Dose adjustments this admission: None  Microbiology results: None  Thank you for allowing pharmacy to be a part of this patient's care.  Oswald Hillock, PharmD, BCPS 09/06/2018 7:30 PM

## 2018-09-06 NOTE — ED Notes (Addendum)
ED TO INPATIENT HANDOFF REPORT  ED Nurse Name and Phone #: Karena Addison [phone]  S Name/Age/Gender Tracy Acevedo 85 y.o. female Room/Bed: ED09A/ED09A  Code Status   Code Status: Not on file  Home/SNF/Other Home Patient oriented to: self, place, time and situation Is this baseline? Yes   Triage Complete: Triage complete  Chief Complaint Abnormal Lab  Triage Note Pt arrived via [facility] from [facility] and went to a drs appt today. They called her to let her know that her labs were showing renal failure and that she should be evaluated. Pt also is SOB but she states that is normal due to her COPD.    Allergies Allergies  Allergen Reactions  . Aspirin Nausea And Vomiting and Nausea Only  . Penicillins Nausea And Vomiting, Hives and Itching  . Sulfa Antibiotics Nausea And Vomiting, Hives and Itching  . Tramadol Itching and Hives    hives hives hives  . Tramadol Hcl     Level of Care/Admitting Diagnosis ED Disposition    ED Disposition Condition Comment   Admit  The patient appears reasonably stabilized for admission considering the current resources, flow, and capabilities available in the ED at this time, and I doubt any other [facility] requiring further screening and/or treatment in the ED prior to admission is  present.       B Medical/Surgery History Past Medical History:  Diagnosis Date  . AF (atrial fibrillation) ([place])   . Anemia   . Arthritis, rheumatoid ([place])   . Asthma   . Bruises easily   . Diabetes ([place])   . Hearing loss   . History of swelling of feet   . Memory loss   . Muscle cramp   . Muscle pain   . Osteoporosis    Past Surgical History:  Procedure Laterality Date  . COLONOSCOPY    . EYE SURGERY Right   . GALLBLADDER SURGERY    . HIP SURGERY Bilateral   . KNEE SURGERY Right      A IV Location/Drains/Wounds Patient Lines/Drains/Airways Status   Active Line/Drains/Airways    Name:   Placement date:   Placement time:   Site:    Days:   Peripheral IV 09/06/18 Right Antecubital   09/06/18    1548    Antecubital   less than 1   Peripheral IV 09/06/18 Left Forearm   09/06/18    1548    Forearm   less than 1          Intake/Output Last 24 hours No intake or output data in the 24 hours ending 09/06/18 1922  Labs/Imaging Results for orders placed or performed during the hospital encounter of 09/06/18 (from the past 48 hour(s))  CBC with Differential/Platelet     Status: Abnormal   Collection Time: 09/06/18  3:43 PM  Result Value Ref Range   WBC 12.3 (H) 4.0 - 10.5 K/uL   RBC 4.17 3.87 - 5.11 MIL/uL   Hemoglobin 11.5 (L) 12.0 - 15.0 g/dL   HCT 34.6 (L) 36.0 - 46.0 %   MCV 83.0 80.0 - 100.0 fL   MCH 27.6 26.0 - 34.0 pg   MCHC 33.2 30.0 - 36.0 g/dL   RDW 14.8 11.5 - 15.5 %   Platelets 257 150 - 400 K/uL   nRBC 0.0 0.0 - 0.2 %   Neutrophils Relative % 72 %   Neutro Abs 8.9 (H) 1.7 - 7.7 K/uL   Lymphocytes Relative 12 %   Lymphs Abs  1.5 0.7 - 4.0 K/uL   Monocytes Relative 12 %   Monocytes Absolute 1.5 (H) 0.1 - 1.0 K/uL   Eosinophils Relative 1 %   Eosinophils Absolute 0.1 0.0 - 0.5 K/uL   Basophils Relative 1 %   Basophils Absolute 0.1 0.0 - 0.1 K/uL   Immature Granulocytes 2 %   Abs Immature Granulocytes 0.26 (H) 0.00 - 0.07 K/uL    Comment: Performed at [facility], [address] [place], [place] [address]  Comprehensive metabolic panel     Status: Abnormal   Collection Time: 09/06/18  3:43 PM  Result Value Ref Range   Sodium 137 135 - 145 mmol/L   Potassium 4.0 3.5 - 5.1 mmol/L   Chloride 102 98 - 111 mmol/L   CO2 21 (L) 22 - 32 mmol/L   Glucose, Bld 124 (H) 70 - 99 mg/dL   BUN 67 (H) 8 - 23 mg/dL   Creatinine, Ser 6.042.70 (H) 0.44 - 1.00 mg/dL   Calcium 9.4 8.9 - 54.010.3 mg/dL   Total Protein 7.4 6.5 - 8.1 g/dL   Albumin 2.9 (L) 3.5 - 5.0 g/dL   AST 34 15 - 41 U/L   ALT 40 0 - 44 U/L   Alkaline Phosphatase 116 38 - 126 U/L   Total Bilirubin 1.1 0.3 - 1.2 mg/dL   GFR calc non Af Amer  15 (L) >60 mL/min   GFR calc Af Amer 17 (L) >60 mL/min   Anion gap 14 5 - 15    Comment: Performed at [facility], [address] [place], [place] [address]  Lipase, blood     Status: None   Collection Time: 09/06/18  3:43 PM  Result Value Ref Range   Lipase 25 11 - 51 U/L    Comment: Performed at [facility], [address] [place], [place] [address]  SARS Coronavirus 2 (CEPHEID - Performed in [facility] hospital lab), Hosp Order     Status: None   Collection Time: 09/06/18  3:44 PM  Result Value Ref Range   SARS Coronavirus 2 NEGATIVE NEGATIVE    Comment: (NOTE) If result is NEGATIVE SARS-CoV-2 target nucleic acids are NOT DETECTED. The SARS-CoV-2 RNA is generally detectable in upper and lower  respiratory specimens during the acute phase of infection. The lowest  concentration of SARS-CoV-2 viral copies this assay can detect is 250  copies / mL. A negative result does not preclude SARS-CoV-2 infection  and should not be used as the sole basis for treatment or other  patient management decisions.  A negative result may occur with  improper specimen collection / handling, submission of specimen other  than nasopharyngeal swab, presence of viral mutation(s) within the  areas targeted by this assay, and inadequate number of viral copies  (<250 copies / mL). A negative result must be combined with clinical  observations, patient history, and epidemiological information. If result is POSITIVE SARS-CoV-2 target nucleic acids are DETECTED. The SARS-CoV-2 RNA is generally detectable in upper and lower  respiratory specimens dur ing the acute phase of infection.  Positive  results are indicative of active infection with SARS-CoV-2.  Clinical  correlation with patient history and other diagnostic information is  necessary to determine patient infection status.  Positive results do  not rule out bacterial infection or co-infection with other viruses. If result is  PRESUMPTIVE POSTIVE SARS-CoV-2 nucleic acids MAY BE PRESENT.   A presumptive positive result was obtained on the submitted specimen  and  confirmed on repeat testing.  While 2019 novel coronavirus  (SARS-CoV-2) nucleic acids may be present in the submitted sample  additional confirmatory testing may be necessary for epidemiological  and / or clinical management purposes  to differentiate between  SARS-CoV-2 and other Sarbecovirus currently known to infect humans.  If clinically indicated additional testing with an alternate test  methodology [phone] is advised. The SARS-CoV-2 RNA is generally  detectable in upper and lower respiratory sp ecimens during the acute  phase of infection. The expected result is Negative. Fact Sheet for Patients:  BoilerBrush.com.cyhttps://www.fda.gov/media/136312/download Fact Sheet for Healthcare Providers: https://pope.com/https://www.fda.gov/media/136313/download This test is not yet approved or cleared by the [place] FDA and has been authorized for detection and/or diagnosis of SARS-CoV-2 by FDA under an Emergency Use Authorization (EUA).  This EUA will remain in effect (meaning this test can be used) for the duration of the COVID-19 declaration under Section 564(b)(1) of the Act, 21 U.S.C. section 360bbb-3(b)(1), unless the authorization is terminated or revoked sooner. Performed at [facility], [address] [place], [place] [address]    Ct Abdomen Pelvis Wo Contrast  Result Date: 09/06/2018 CLINICAL DATA:  Renal failure, shortness of breath. EXAM: CT ABDOMEN AND PELVIS WITHOUT CONTRAST TECHNIQUE: Multidetector CT imaging of the abdomen and pelvis was performed following the standard protocol without IV contrast. COMPARISON:  CT scan of September 22, 2007. FINDINGS: Lower chest: No acute abnormality. Hepatobiliary: No focal liver abnormality is seen. No gallstones, gallbladder wall thickening, or biliary dilatation. Pancreas: Unremarkable. No pancreatic ductal dilatation or  surrounding inflammatory changes. Spleen: Normal in size without focal abnormality. Adrenals/Urinary Tract: Stable left adrenal adenoma is noted. Right adrenal gland appears normal. No hydronephrosis or renal obstruction is noted. No renal or ureteral calculi are noted. Urinary bladder is unremarkable. Stomach/Bowel: Stomach is within normal limits. Appendix appears normal. No evidence of bowel wall thickening, distention, or inflammatory changes. Diverticulosis of sigmoid colon is noted without inflammation. Vascular/Lymphatic: Aortic atherosclerosis. No enlarged abdominal or pelvic lymph nodes. Reproductive: Uterus and bilateral adnexa are unremarkable. Other: No abdominal wall hernia or abnormality. No abdominopelvic ascites. Musculoskeletal: Status post bilateral total hip arthroplasties. No acute osseous abnormality is noted. IMPRESSION: No acute abnormality seen in the abdomen or pelvis. Sigmoid diverticulosis without inflammation. Stable left adrenal adenoma. Aortic Atherosclerosis (ICD10-I70.0). Electronically Signed   By: Lupita RaiderJames  Green Jr M.D.   On: 09/06/2018 18:56   Dg Chest Portable 1 View  Result Date: 09/06/2018 CLINICAL DATA:  Renal failure.  Shortness of breath. EXAM: PORTABLE CHEST 1 VIEW COMPARISON:  September 22, 2007 FINDINGS: Focal infiltrate is seen in the right upper lobe. Cardiomegaly. The hila and mediastinum are stable. No other interval changes. IMPRESSION: Focal infiltrate in the right upper lobe worrisome for pneumonia or aspiration. Recommend short-term follow-up to ensure resolution. Electronically Signed   By: Gerome Samavid  Williams III M.D   On: 09/06/2018 16:12    Pending Labs Unresulted Labs (From admission, onward)    Start     Ordered   09/06/18 1915  Blood culture (routine x 2)  BLOOD CULTURE X 2,   STAT     09/06/18 1915   09/06/18 1543  Urinalysis, Complete w Microscopic  Once,   STAT     09/06/18 1542          Vitals/Pain Today's Vitals   09/06/18 1536 09/06/18 1537  09/06/18 1538 09/06/18 1539  BP:   (!) 148/64 (!) 148/64  Pulse:   64 81  Resp:  16  Temp: 98.1 F (36.7 C)     TempSrc: Oral     SpO2:   96% 99%  Weight:  65.3 kg    Height:  5\' 5"  (1.651 m)    PainSc: 0-No pain       Isolation Precautions No active isolations  Medications Medications  cefTRIAXone (ROCEPHIN) 2 g in sodium chloride 0.9 % 100 mL IVPB (has no administration in time range)  azithromycin (ZITHROMAX) 500 mg in sodium chloride 0.9 % 250 mL IVPB (has no administration in time range)  sodium chloride 0.9 % bolus 500 mL (500 mLs Intravenous New Bag/Given 09/06/18 1657)  0.9 %  sodium chloride infusion ( Intravenous New Bag/Given 09/06/18 1916)    Mobility walks with person assist Low fall risk   Focused Assessments    R Recommendations: See Admitting Provider Note  Report given to: 11/06/18, RN

## 2018-09-06 NOTE — ED Triage Notes (Signed)
Pt arrived via [facility] from [facility] and went to a drs appt today. They called her to let her know that her labs were showing renal failure and that she should be evaluated. Pt also is SOB but she states that is normal due to her COPD.

## 2018-09-06 NOTE — H&P (Signed)
[facility] [place] [facility]   PATIENT NAME: Tracy Acevedo    MR#:  [id number]  DATE OF BIRTH:  1925/12/20  DATE OF ADMISSION:  09/06/2018  PRIMARY CARE PHYSICIAN: Marguarite ArbourSparks, Jeffrey D, MD   REQUESTING/REFERRING PHYSICIAN:   CHIEF COMPLAINT:   Chief Complaint  Patient presents with  . Abnormal Lab    HISTORY OF PRESENT ILLNESS: Tracy Acevedo  is a 85 y.o. female with a known history per below was seen in congenital clinic today and had blood work done routinely, was sent back to her independent living facility, primary care provider then instructed the patient to go to the emergency room due to acute renal failure on chemistries, patient also with complaints of chronic shortness of breath, had episode of nausea, emesis, and emergency room patient was found to have pneumonia on chest x-ray, white count 12,000, creatinine 2.7 with baseline unknown, BUN 67, hospitalist asked to admit, patient in no apparent distress, resting comfortably in bed, patient now been admitted for acute H CAP, acute kidney injury.  PAST MEDICAL HISTORY:   Past Medical History:  Diagnosis Date  . AF (atrial fibrillation) ([facility])   . Anemia   . Arthritis, rheumatoid ([facility])   . Asthma   . Bruises easily   . Diabetes ([facility])   . Hearing loss   . History of swelling of feet   . Memory loss   . Muscle cramp   . Muscle pain   . Osteoporosis     PAST SURGICAL HISTORY:  Past Surgical History:  Procedure Laterality Date  . COLONOSCOPY    . EYE SURGERY Right   . GALLBLADDER SURGERY    . HIP SURGERY Bilateral   . KNEE SURGERY Right     SOCIAL HISTORY:  Social History   Tobacco Use  . Smoking status: Former Games developermoker  . Smokeless tobacco: Never Used  Substance Use Topics  . Alcohol use: Yes    Comment: social    FAMILY HISTORY:  Hypertension  DRUG ALLERGIES:  Allergies  Allergen Reactions  . Aspirin Nausea And Vomiting and Nausea Only  . Penicillins Nausea And Vomiting, Hives  and Itching  . Sulfa Antibiotics Nausea And Vomiting, Hives and Itching  . Tramadol Itching and Hives    hives hives hives  . Tramadol Hcl     REVIEW OF SYSTEMS:   CONSTITUTIONAL: No fever, +fatigue, weakness.  EYES: No blurred or double vision.  EARS, NOSE, AND THROAT: No tinnitus or ear pain.  RESPIRATORY: + cough, shortness of breath, wheezing, no hemoptysis.  CARDIOVASCULAR: No chest pain, orthopnea, edema.  GASTROINTESTINAL: No nausea, +vomiting, no diarrhea or abdominal pain.  GENITOURINARY: No dysuria, hematuria.  ENDOCRINE: No polyuria, nocturia,  HEMATOLOGY: No anemia, easy bruising or bleeding SKIN: No rash or lesion. MUSCULOSKELETAL: No joint pain or arthritis.   NEUROLOGIC: No tingling, numbness, weakness.  PSYCHIATRY: No anxiety or depression.   MEDICATIONS AT HOME:  Prior to Admission medications   Medication Sig Start Date End Date Taking? Authorizing Provider  albuterol (VENTOLIN HFA) 108 (90 BASE) MCG/ACT inhaler Inhale 1 puff into the lungs every 6 (six) hours as needed for wheezing or shortness of breath.    [provider]  BIOTIN PO Take by mouth daily. Dose unknown    [provider]  Calcium Carbonate-Vitamin D (CALTRATE 600+D PO) Take 1 tablet by mouth daily.    [provider]  cetirizine (ZYRTEC) 10 MG tablet Take 10 mg by mouth daily.  [provider]  dabigatran (PRADAXA) 150 MG CAPS capsule Take 150 mg by mouth 2 (two) times daily.    [provider]  digoxin (LANOXIN) 0.125 MG tablet Take 0.125 mg by mouth daily.    [provider]  diltiazem (CARDIZEM CD) 360 MG 24 hr capsule Take by mouth. 12/25/17   [provider]  EPINEPHrine (EPIPEN) 0.3 mg/0.3 mL SOAJ injection Inject 0.3 mg into the muscle once.    [provider]  esomeprazole (NEXIUM) 40 MG capsule Take 40 mg by mouth daily at 12 noon.    [provider]  Eszopiclone (ESZOPICLONE) 3 MG TABS Take 3 mg by mouth  at bedtime. Take immediately before bedtime    [provider]  Fluticasone-Salmeterol (ADVAIR) 100-50 MCG/DOSE AEPB Inhale 1 puff into the lungs 2 (two) times daily. Advair    [provider]  Glucosamine-Chondroitin-MSM 500-400-300 MG TABS Take 1 tablet by mouth daily. Not sure of complete formula. Pt recalled Glucosamine-Chondroiton and MSM.    [provider]  hydrochlorothiazide (HYDRODIURIL) 12.5 MG tablet  08/16/18   [provider]  lansoprazole (PREVACID) 30 MG capsule Take by mouth daily. 07/15/18   [provider]  losartan-hydrochlorothiazide (HYZAAR) 100-12.5 MG tablet Take 1 tablet by mouth daily.    [provider]  montelukast (SINGULAIR) 10 MG tablet Take 10 mg by mouth at bedtime.    [provider]  NASACORT ALLERGY 24HR 55 MCG/ACT AERO nasal inhaler 2 sprays daily. 03/13/18   [provider]  Probiotic Product (PROBIOTIC DAILY PO) Take 1 capsule by mouth daily.    [provider]  S-Adenosylmethionine (SAM-E PO) Take 1 tablet by mouth daily.    [provider]      PHYSICAL EXAMINATION:   VITAL SIGNS: Blood pressure (!) 145/62, pulse (!) 130, temperature 98.1 F (36.7 C), temperature source Oral, resp. rate (!) 28, height 5\' 5"  (1.651 m), weight 65.3 kg, SpO2 94 %.  GENERAL:  85 y.o.-year-old patient lying in the bed with no acute distress. Frail appearing EYES: Pupils equal, round, reactive to light and accommodation. No scleral icterus. Extraocular muscles intact.  HEENT: Head atraumatic, normocephalic. Oropharynx and nasopharynx clear.  NECK:  Supple, no jugular venous distention. No thyroid enlargement, no tenderness.  LUNGS: Normal breath sounds bilaterally, no wheezing, rales,rhonchi or crepitation. No use of accessory muscles of respiration.  CARDIOVASCULAR: S1, S2 normal. No murmurs, rubs, or gallops.  ABDOMEN: Soft, nontender, nondistended. Bowel sounds present. No organomegaly  or mass.  EXTREMITIES: No pedal edema, cyanosis, or clubbing. Diffuse atrophy  NEUROLOGIC: Cranial nerves II through XII are intact. MAES. Gait not checked.  PSYCHIATRIC: The patient is alert and oriented x 3.  SKIN: No obvious rash, lesion, or ulcer.   LABORATORY PANEL:   CBC Recent Labs  Lab 09/06/18 1543  WBC 12.3*  HGB 11.5*  HCT 34.6*  PLT 257  MCV 83.0  MCH 27.6  MCHC 33.2  RDW 14.8  LYMPHSABS 1.5  MONOABS 1.5*  EOSABS 0.1  BASOSABS 0.1   ------------------------------------------------------------------------------------------------------------------  Chemistries  Recent Labs  Lab 09/06/18 1543  NA 137  K 4.0  CL 102  CO2 21*  GLUCOSE 124*  BUN 67*  CREATININE 2.70*  CALCIUM 9.4  AST 34  ALT 40  ALKPHOS 116  BILITOT 1.1   ------------------------------------------------------------------------------------------------------------------ estimated creatinine clearance is 12.5 mL/min (A) (by C-G formula based on SCr of 2.7 mg/dL (H)). ------------------------------------------------------------------------------------------------------------------ No results for input(s): TSH, T4TOTAL, T3FREE, THYROIDAB in the last  72 hours.  Invalid input(s): FREET3   Coagulation profile No results for input(s): INR, PROTIME in the last 168 hours. ------------------------------------------------------------------------------------------------------------------- No results for input(s): DDIMER in the last 72 hours. -------------------------------------------------------------------------------------------------------------------  Cardiac Enzymes No results for input(s): CKMB, TROPONINI, MYOGLOBIN in the last 168 hours.  Invalid input(s): CK ------------------------------------------------------------------------------------------------------------------ Invalid input(s):  POCBNP  ---------------------------------------------------------------------------------------------------------------  Urinalysis No results found for: COLORURINE, APPEARANCEUR, LABSPEC, PHURINE, GLUCOSEU, HGBUR, BILIRUBINUR, KETONESUR, PROTEINUR, UROBILINOGEN, NITRITE, LEUKOCYTESUR   RADIOLOGY: Ct Abdomen Pelvis Wo Contrast  Result Date: 09/06/2018 CLINICAL DATA:  Renal failure, shortness of breath. EXAM: CT ABDOMEN AND PELVIS WITHOUT CONTRAST TECHNIQUE: Multidetector CT imaging of the abdomen and pelvis was performed following the standard protocol without IV contrast. COMPARISON:  CT scan of September 22, 2007. FINDINGS: Lower chest: No acute abnormality. Hepatobiliary: No focal liver abnormality is seen. No gallstones, gallbladder wall thickening, or biliary dilatation. Pancreas: Unremarkable. No pancreatic ductal dilatation or surrounding inflammatory changes. Spleen: Normal in size without focal abnormality. Adrenals/Urinary Tract: Stable left adrenal adenoma is noted. Right adrenal gland appears normal. No hydronephrosis or renal obstruction is noted. No renal or ureteral calculi are noted. Urinary bladder is unremarkable. Stomach/Bowel: Stomach is within normal limits. Appendix appears normal. No evidence of bowel wall thickening, distention, or inflammatory changes. Diverticulosis of sigmoid colon is noted without inflammation. Vascular/Lymphatic: Aortic atherosclerosis. No enlarged abdominal or pelvic lymph nodes. Reproductive: Uterus and bilateral adnexa are unremarkable. Other: No abdominal wall hernia or abnormality. No abdominopelvic ascites. Musculoskeletal: Status post bilateral total hip arthroplasties. No acute osseous abnormality is noted. IMPRESSION: No acute abnormality seen in the abdomen or pelvis. Sigmoid diverticulosis without inflammation. Stable left adrenal adenoma. Aortic Atherosclerosis (ICD10-I70.0). Electronically Signed   By: Marijo Conception M.D.   On: 09/06/2018 18:56   Dg  Chest Portable 1 View  Result Date: 09/06/2018 CLINICAL DATA:  Renal failure.  Shortness of breath. EXAM: PORTABLE CHEST 1 VIEW COMPARISON:  September 22, 2007 FINDINGS: Focal infiltrate is seen in the right upper lobe. Cardiomegaly. The hila and mediastinum are stable. No other interval changes. IMPRESSION: Focal infiltrate in the right upper lobe worrisome for pneumonia or aspiration. Recommend short-term follow-up to ensure resolution. Electronically Signed   By: Dorise Bullion III M.D   On: 09/06/2018 16:12    EKG: Orders placed or performed during the hospital encounter of 09/06/18  . EKG 12-Lead  . EKG 12-Lead    IMPRESSION AND PLAN: *Acute H CAP Pneumonia protocol, empiric vancomycin/cefepime, follow-up on cultures  *Acute kidney injury Baseline renal function unknown Avoid nephrotoxic agents, strict I&O monitoring, daily weights, check renal ultrasound, nephrology to see, gentle IV fluids rehydration, hold diuretics/lisinopril, and continue close medical monitoring  *Chronic atrial fibrillation Stable Hold digoxin given worsening renal status-check level in the morning, continue diltiazem, Pradaxa  *COPD without exacerbation Stable Continue home regiment  *Chronic diabetes mellitus type 2 Stable Slight scale insulin with Accu-Cheks per routine  *Chronic GERD PPI daily  All the records are reviewed and case discussed with ED provider. Management plans discussed with the patient, family and they are in agreement.  CODE STATUS:full   TOTAL TIME TAKING CARE OF THIS PATIENT: 40 minutes.    Avel Peace Salary M.D on 09/06/2018   Between 7am to 6pm - Pager - [phone]  After 6pm go to [email or web address] - password [email or web address] [facility]  Office  [phone]  CC: Primary care physician; Idelle Crouch, MD   Note: This dictation was prepared with Dragon dictation along with  smaller phrase technology. Any transcriptional errors that result from this  process are unintentional.

## 2018-09-06 NOTE — ED Notes (Signed)
Pt assisted to toilet moderate assistance. Pt placed back in bed with purewick external catheter.

## 2018-09-06 NOTE — ED Provider Notes (Signed)
[facility] Emergency Department Provider Note    First MD Initiated Contact with Patient 09/06/18 1533     (approximate)  I have reviewed the triage vital signs and the nursing notes.   HISTORY  Chief Complaint Abnormal Lab    HPI Tracy Acevedo is a 85 y.o. female plosive past medical history presents the ER for evaluation of dehydration and AKI.  Patient has been having trouble keeping any food down feeling severe nausea and decreased appetite over the past 2 weeks.  Was seen in clinic as an outpatient this morning had routine blood work showing evidence of AKI she is directed to the ER for further evaluation.  She denies any pain.  No fevers.  Denies any diarrhea.  Does have significant family history of pancreatic and breast cancer.    Past Medical History:  Diagnosis Date  . AF (atrial fibrillation) (HCC)   . Anemia   . Arthritis, rheumatoid (HCC)   . Asthma   . Bruises easily   . Diabetes (HCC)   . Hearing loss   . History of swelling of feet   . Memory loss   . Muscle cramp   . Muscle pain   . Osteoporosis    No family history on file. Past Surgical History:  Procedure Laterality Date  . COLONOSCOPY    . EYE SURGERY Right   . GALLBLADDER SURGERY    . HIP SURGERY Bilateral   . KNEE SURGERY Right    Patient Active Problem List   Diagnosis Date Noted  . Cataract extraction status of eye, left 09/19/2015  . Pseudophakia of right eye 09/19/2015  . Acute ill-defined cerebrovascular disease 07/13/2013  . Absolute anemia 07/13/2013  . Atrial fibrillation (HCC) 07/13/2013  . Chronic obstructive pulmonary disease (HCC) 07/13/2013  . Dermatophytic onychia 07/13/2013  . Diabetes mellitus (HCC) 07/13/2013  . Enthesopathy of hip 07/13/2013  . Bony exostosis 07/13/2013  . Pure hypercholesterolemia 07/13/2013  . Rheumatoid arthritis (HCC) 07/13/2013  . Synovitis and tenosynovitis 07/13/2013  . Panlobular emphysema (HCC) 07/13/2013  .  Rheumatoid arthritis involving multiple joints (HCC) 07/13/2013  . OBESITY, UNSPECIFIED 01/20/2008  . Essential hypertension, benign 01/06/2008  . UNSPECIFIED OSTEOPOROSIS 01/06/2008      Prior to Admission medications   Medication Sig Start Date End Date Taking? Authorizing Provider  albuterol (VENTOLIN HFA) 108 (90 BASE) MCG/ACT inhaler Inhale 1 puff into the lungs every 6 (six) hours as needed for wheezing or shortness of breath.    [provider]  BIOTIN PO Take by mouth daily. Dose unknown    [provider]  Calcium Carbonate-Vitamin D (CALTRATE 600+D PO) Take 1 tablet by mouth daily.    [provider]  cetirizine (ZYRTEC) 10 MG tablet Take 10 mg by mouth daily.    [provider]  dabigatran (PRADAXA) 150 MG CAPS capsule Take 150 mg by mouth 2 (two) times daily.    [provider]  digoxin (LANOXIN) 0.125 MG tablet Take 0.125 mg by mouth daily.    [provider]  diltiazem (CARDIZEM CD) 360 MG 24 hr capsule Take by mouth. 12/25/17   [provider]  EPINEPHrine (EPIPEN) 0.3 mg/0.3 mL SOAJ injection Inject 0.3 mg into the muscle once.    [provider]  esomeprazole (NEXIUM) 40 MG capsule Take 40 mg by mouth daily at 12 noon.    [provider]  Eszopiclone (ESZOPICLONE) 3 MG TABS Take 3 mg by mouth at bedtime. Take immediately  before bedtime    [provider]  Fluticasone-Salmeterol (ADVAIR) 100-50 MCG/DOSE AEPB Inhale 1 puff into the lungs 2 (two) times daily. Advair    [provider]  Glucosamine-Chondroitin-MSM 500-400-300 MG TABS Take 1 tablet by mouth daily. Not sure of complete formula. Pt recalled Glucosamine-Chondroiton and MSM.    [provider]  hydrochlorothiazide (HYDRODIURIL) 12.5 MG tablet  08/16/18   [provider]  lansoprazole (PREVACID) 30 MG capsule Take by mouth daily. 07/15/18   [provider]  losartan-hydrochlorothiazide (HYZAAR)  100-12.5 MG tablet Take 1 tablet by mouth daily.    [provider]  montelukast (SINGULAIR) 10 MG tablet Take 10 mg by mouth at bedtime.    [provider]  NASACORT ALLERGY 24HR 55 MCG/ACT AERO nasal inhaler 2 sprays daily. 03/13/18   [provider]  Probiotic Product (PROBIOTIC DAILY PO) Take 1 capsule by mouth daily.    [provider]  S-Adenosylmethionine (SAM-E PO) Take 1 tablet by mouth daily.    [provider]    Allergies Aspirin; Penicillins; Sulfa antibiotics; Tramadol; and Tramadol hcl    Social History Social History   Tobacco Use  . Smoking status: Former Games developer  . Smokeless tobacco: Never Used  Substance Use Topics  . Alcohol use: Yes    Comment: social  . Drug use: No    Review of Systems Patient denies headaches, rhinorrhea, blurry vision, numbness, shortness of breath, chest pain, edema, cough, abdominal pain, nausea, vomiting, diarrhea, dysuria, fevers, rashes or hallucinations unless otherwise stated above in HPI. ____________________________________________   PHYSICAL EXAM:  VITAL SIGNS: Vitals:   09/06/18 1538 09/06/18 1539  BP: (!) 148/64 (!) 148/64  Pulse: 64 81  Resp:  16  Temp:    SpO2: 96% 99%    Constitutional: Alert and oriented.  Eyes: Conjunctivae are normal.  Head: Atraumatic. Nose: No congestion/rhinnorhea. Mouth/Throat: Mucous membranes are dry.   Neck: No stridor. Painless ROM.  Cardiovascular: Normal rate, regular rhythm. Grossly normal heart sounds.  Good peripheral circulation. Respiratory: Normal respiratory effort.  No retractions. Lungs CTAB. Gastrointestinal: Soft and nontender. No distention. No abdominal bruits. No CVA tenderness. Genitourinary: deferred Musculoskeletal: No lower extremity tenderness nor edema.  No joint effusions. Neurologic:  Normal speech and language. No gross focal neurologic deficits are appreciated. No facial droop Skin:  Skin is warm, dry and  intact. No rash noted. Psychiatric: Mood and affect are normal. Speech and behavior are normal.  ____________________________________________   LABS (all labs ordered are listed, but only abnormal results are displayed)  Results for orders placed or performed during the hospital encounter of 09/06/18 (from the past 24 hour(s))  CBC with Differential/Platelet     Status: Abnormal   Collection Time: 09/06/18  3:43 PM  Result Value Ref Range   WBC 12.3 (H) 4.0 - 10.5 K/uL   RBC 4.17 3.87 - 5.11 MIL/uL   Hemoglobin 11.5 (L) 12.0 - 15.0 g/dL   HCT 78.2 (L) 95.6 - 21.3 %   MCV 83.0 80.0 - 100.0 fL   MCH 27.6 26.0 - 34.0 pg   MCHC 33.2 30.0 - 36.0 g/dL   RDW 08.6 57.8 - 46.9 %   Platelets 257 150 - 400 K/uL   nRBC 0.0 0.0 - 0.2 %   Neutrophils Relative % 72 %   Neutro Abs 8.9 (H) 1.7 - 7.7 K/uL   Lymphocytes Relative 12 %   Lymphs Abs 1.5 0.7 - 4.0 K/uL   Monocytes Relative 12 %  Monocytes Absolute 1.5 (H) 0.1 - 1.0 K/uL   Eosinophils Relative 1 %   Eosinophils Absolute 0.1 0.0 - 0.5 K/uL   Basophils Relative 1 %   Basophils Absolute 0.1 0.0 - 0.1 K/uL   Immature Granulocytes 2 %   Abs Immature Granulocytes 0.26 (H) 0.00 - 0.07 K/uL  Comprehensive metabolic panel     Status: Abnormal   Collection Time: 09/06/18  3:43 PM  Result Value Ref Range   Sodium 137 135 - 145 mmol/L   Potassium 4.0 3.5 - 5.1 mmol/L   Chloride 102 98 - 111 mmol/L   CO2 21 (L) 22 - 32 mmol/L   Glucose, Bld 124 (H) 70 - 99 mg/dL   BUN 67 (H) 8 - 23 mg/dL   Creatinine, Ser 1.612.70 (H) 0.44 - 1.00 mg/dL   Calcium 9.4 8.9 - 09.610.3 mg/dL   Total Protein 7.4 6.5 - 8.1 g/dL   Albumin 2.9 (L) 3.5 - 5.0 g/dL   AST 34 15 - 41 U/L   ALT 40 0 - 44 U/L   Alkaline Phosphatase 116 38 - 126 U/L   Total Bilirubin 1.1 0.3 - 1.2 mg/dL   GFR calc non Af Amer 15 (L) >60 mL/min   GFR calc Af Amer 17 (L) >60 mL/min   Anion gap 14 5 - 15  Lipase, blood     Status: None   Collection Time: 09/06/18  3:43 PM  Result Value Ref  Range   Lipase 25 11 - 51 U/L  SARS Coronavirus 2 (CEPHEID - Performed in [facility] hospital lab), Hosp Order     Status: None   Collection Time: 09/06/18  3:44 PM  Result Value Ref Range   SARS Coronavirus 2 NEGATIVE NEGATIVE   ____________________________________________  EKG My review and personal interpretation at Time: 15:45   Indication: nausea  Rate: 80  Rhythm: coarse afib vs variably conducted aflutter Axis: normal Other: abnl ekg ____________________________________________  RADIOLOGY  I personally reviewed all radiographic images ordered to evaluate for the above acute complaints and reviewed radiology reports and findings.  These findings were personally discussed with the patient.  Please see medical record for radiology report.  ____________________________________________   PROCEDURES  Procedure(s) performed:  .Critical Care Performed by: Willy Eddyobinson, Aviv Rota, MD Authorized by: Willy Eddyobinson, Haniyyah Sakuma, MD   Critical care provider statement:    Critical care time (minutes):  30   Critical care time was exclusive of:  Separately billable procedures and treating other patients   Critical care was necessary to treat or prevent imminent or life-threatening deterioration of the following conditions:  Renal failure   Critical care was time spent personally by me on the following activities:  Development of treatment plan with patient or surrogate, discussions with consultants, evaluation of patient's response to treatment, examination of patient, obtaining history from patient or surrogate, ordering and performing treatments and interventions, ordering and review of laboratory studies, ordering and review of radiographic studies, pulse oximetry, re-evaluation of patient's condition and review of old charts      Critical Care performed: yes ____________________________________________   INITIAL IMPRESSION / ASSESSMENT AND PLAN / ED COURSE  Pertinent labs & imaging results  that were available during my care of the patient were reviewed by me and considered in my medical decision making (see chart for details).   DDX: dehydration, obstruction, electrolyte abn, mass, sbo, malignancy  Tracy Acevedo is a 85 y.o. who presents to the ED with symptoms as described above.  Patient frail  appearing but without any fever or hypoxia.  Blood work and imaging will be ordered for the by differential.  Will give IV fluids as she does appear clinically dehydrated.  Clinical Course as of Sep 05 1917  Mon Sep 06, 2018  1914 CT imaging shows no obstruction.  Will be treated with antibiotics for community-acquired pneumonia.  Will require admission for further medical management.   [PR]    Clinical Course User Index [PR] Merlyn Lot, MD    The patient was evaluated in Emergency Department today for the symptoms described in the history of present illness. He/she was evaluated in the context of the global COVID-19 pandemic, which necessitated consideration that the patient might be at risk for infection with the SARS-CoV-2 virus that causes COVID-19. Institutional protocols and algorithms that pertain to the evaluation of patients at risk for COVID-19 are in a state of rapid change based on information released by regulatory bodies including the CDC and federal and state organizations. These policies and algorithms were followed during the patient's care in the ED.  As part of my medical decision making, I reviewed the following data within the [facility] notes reviewed and incorporated, Labs reviewed, notes from prior ED visits and [place] Controlled Substance Database   ____________________________________________   FINAL CLINICAL IMPRESSION(S) / ED DIAGNOSES  Final diagnoses:  AKI (acute kidney injury) ([place])  Pneumonia of right upper lobe due to infectious organism [facility])      NEW MEDICATIONS STARTED DURING THIS VISIT:  New Prescriptions    No medications on file     Note:  This document was prepared using Dragon voice recognition software and may include unintentional dictation errors.    Merlyn Lot, MD 09/06/18 1919

## 2018-09-07 ENCOUNTER — Encounter: Payer: Self-pay | Admitting: *Deleted

## 2018-09-07 ENCOUNTER — Inpatient Hospital Stay: Payer: Medicare Other

## 2018-09-07 LAB — GLUCOSE, CAPILLARY
Glucose-Capillary: 93 mg/dL (ref 70–99)
Glucose-Capillary: 105 mg/dL — ABNORMAL HIGH (ref 70–99)
Glucose-Capillary: 119 mg/dL — ABNORMAL HIGH (ref 70–99)
Glucose-Capillary: 123 mg/dL — ABNORMAL HIGH (ref 70–99)

## 2018-09-07 LAB — BASIC METABOLIC PANEL
Anion gap: 9 (ref 5–15)
BUN: 52 mg/dL — ABNORMAL HIGH (ref 8–23)
CO2: 20 mmol/L — ABNORMAL LOW (ref 22–32)
Calcium: 8.4 mg/dL — ABNORMAL LOW (ref 8.9–10.3)
Chloride: 112 mmol/L — ABNORMAL HIGH (ref 98–111)
Creatinine, Ser: 1.68 mg/dL — ABNORMAL HIGH (ref 0.44–1.00)
GFR calc Af Amer: 31 mL/min — ABNORMAL LOW (ref >60)
GFR calc non Af Amer: 26 mL/min — ABNORMAL LOW (ref >60)
Glucose, Bld: 102 mg/dL — ABNORMAL HIGH (ref 70–99)
Potassium: 3.9 mmol/L (ref 3.5–5.1)
Sodium: 141 mmol/L (ref 135–145)

## 2018-09-07 LAB — DIGOXIN LEVEL: Digoxin Level: 1.2 ng/mL (ref 0.8–2.0)

## 2018-09-07 LAB — STREP PNEUMONIAE URINARY ANTIGEN: Strep Pneumo Urinary Antigen: NEGATIVE

## 2018-09-07 LAB — HEMOGLOBIN A1C
Hgb A1c MFr Bld: 5.9 % — ABNORMAL HIGH (ref 4.8–5.6)
Mean Plasma Glucose: 122.63 mg/dL

## 2018-09-07 LAB — PROCALCITONIN: Procalcitonin: 0.59 ng/mL

## 2018-09-07 MED ORDER — CEFUROXIME AXETIL 250 MG PO TABS
500.0000 mg | ORAL_TABLET | Freq: Every day | ORAL | Status: DC
  Administered 2018-09-07 – 2018-09-09 (×3): 500 mg via ORAL
  Filled 2018-09-07 (×3): qty 2

## 2018-09-07 MED ORDER — DABIGATRAN ETEXILATE MESYLATE 75 MG PO CAPS
75.0000 mg | ORAL_CAPSULE | Freq: Two times a day (BID) | ORAL | Status: DC
  Administered 2018-09-07 – 2018-09-09 (×4): 75 mg via ORAL
  Filled 2018-09-07 (×5): qty 1

## 2018-09-07 MED ORDER — ADULT MULTIVITAMIN W/MINERALS CH
1.0000 | ORAL_TABLET | Freq: Every day | ORAL | Status: DC
  Administered 2018-09-08 – 2018-09-09 (×2): 1 via ORAL
  Filled 2018-09-07 (×2): qty 1

## 2018-09-07 MED ORDER — GLUCERNA SHAKE PO LIQD
237.0000 mL | Freq: Three times a day (TID) | ORAL | Status: DC
  Administered 2018-09-07 – 2018-09-08 (×3): 237 mL via ORAL

## 2018-09-07 MED ORDER — VANCOMYCIN VARIABLE DOSE PER UNSTABLE RENAL FUNCTION (PHARMACIST DOSING)
Status: DC
  Filled 2018-09-07: qty 1

## 2018-09-07 MED ORDER — CEFUROXIME AXETIL 250 MG PO TABS
250.0000 mg | ORAL_TABLET | Freq: Two times a day (BID) | ORAL | Status: DC
  Filled 2018-09-07: qty 1

## 2018-09-07 NOTE — Consult Note (Signed)
[facility] CONSULT NOTE    Date: 09/07/2018                  Patient Name:  Tracy Acevedo  MRN: [id number]  DOB: Mar 24, 1926  Age / Sex: 85 y.o., female         PCP: Marguarite Arbour, MD                 Service Requesting Consult: Hospitalist                 Reason for Consult: Acute renal failure            History of Present Illness: Patient is a 85 y.o. female with a PMHx of atrial fibrillation, rheumatoid arthritis, asthma, COPD, diabetes mellitus type 2, memory loss, osteoporosis, who was admitted to [facility] on 09/06/2018 for evaluation of acute renal failure.  Patient recently went to see her primary care physician for blood work.  Upon this routine blood work she was found to have acute renal failure with a creatinine of 2.7.  Her baseline creatinine appears to be 0.8.  Recently she states that she has had rather poor p.o. intake at home.  It appears that the patient was on hydrochlorothiazide at home.  In addition recently she states that she has had some difficulty in preparing her own meals.  She was to begin working with occupational therapy.  Upon admission her creatinine was 2.7.  With IV fluid hydration creatinine down to 1.68.  Good urine output noted via pure wick.   Medications: Outpatient medications: Medications Prior to Admission  Medication Sig Dispense Refill Last Dose  . BIOTIN PO Take by mouth daily. Dose unknown   09/06/2018 at 0800  . Calcium Carbonate-Vitamin D (CALTRATE 600+D PO) Take 1 tablet by mouth daily.   09/06/2018 at 0800  . cefUROXime (CEFTIN) 250 MG tablet Take 250 mg by mouth 2 (two) times a day.   09/06/2018 at 0800  . cetirizine (ZYRTEC) 10 MG tablet Take 10 mg by mouth daily.   09/06/2018 at 0800  . dabigatran (PRADAXA) 150 MG CAPS capsule Take 150 mg by mouth 2 (two) times daily.   09/06/2018 at 0800  . digoxin (LANOXIN) 0.125 MG tablet Take 0.125 mg by mouth daily.   09/06/2018 at 0800  . diltiazem (CARDIZEM CD) 360 MG 24 hr capsule  Take 360 mg by mouth daily.    09/06/2018 at 0800  . esomeprazole (NEXIUM) 40 MG capsule Take 40 mg by mouth daily at 12 noon.   09/06/2018 at 0800  . Eszopiclone (ESZOPICLONE) 3 MG TABS Take 3 mg by mouth at bedtime. Take immediately before bedtime   09/05/2018 at 2100  . Fluticasone-Salmeterol (ADVAIR) 100-50 MCG/DOSE AEPB Inhale 1 puff into the lungs 2 (two) times daily. Advair   09/06/2018 at 0800  . Glucosamine-Chondroitin-MSM 500-400-300 MG TABS Take 1 tablet by mouth daily. Not sure of complete formula. Pt recalled Glucosamine-Chondroiton and MSM.   09/06/2018 at 0800  . hydrochlorothiazide (HYDRODIURIL) 12.5 MG tablet Take 12.5 mg by mouth daily.    09/06/2018 at 0800  . losartan (COZAAR) 100 MG tablet Take 100 mg by mouth daily.   09/06/2018 at 0800  . montelukast (SINGULAIR) 10 MG tablet Take 10 mg by mouth at bedtime.   09/05/2018 at 2100  . Probiotic Product (PROBIOTIC DAILY PO) Take 1 capsule by mouth daily.   09/06/2018 at 0800  . S-Adenosylmethionine (SAM-E PO) Take 1 tablet by mouth daily.  09/06/2018 at 0800  . albuterol (VENTOLIN HFA) 108 (90 BASE) MCG/ACT inhaler Inhale 1 puff into the lungs every 6 (six) hours as needed for wheezing or shortness of breath.   prn at prn  . EPINEPHrine (EPIPEN) 0.3 mg/0.3 mL SOAJ injection Inject 0.3 mg into the muscle once.   prn at prn  . NASACORT ALLERGY 24HR 55 MCG/ACT AERO nasal inhaler 2 sprays daily.   prn at prn    Current medications: Current Facility-Administered Medications  Medication Dose Route Frequency Provider Last Rate Last Dose  . 0.9 %  sodium chloride infusion   Intravenous Continuous Salary, Montell D, MD 50 mL/hr at 09/07/18 0629    . acidophilus (RISAQUAD) capsule 1 capsule  1 capsule Oral Daily Salary, Montell D, MD   1 capsule at 09/07/18 0908  . albuterol (PROVENTIL) (2.5 MG/3ML) 0.083% nebulizer solution 2.5 mg  2.5 mg Inhalation Q6H PRN Salary, Montell D, MD      . cefUROXime (CEFTIN) tablet 250 mg  250 mg Oral BID WC Enedina Finner, MD       . dabigatran (PRADAXA) capsule 75 mg  75 mg Oral Q12H Enedina Finner, MD      . diltiazem (CARDIZEM CD) 24 hr capsule 360 mg  360 mg Oral Daily Salary, Montell D, MD   360 mg at 09/07/18 0909  . feeding supplement (GLUCERNA SHAKE) (GLUCERNA SHAKE) liquid 237 mL  237 mL Oral TID BM Enedina Finner, MD      . fluticasone (FLONASE) 50 MCG/ACT nasal spray 2 spray  2 spray Each Nare Daily PRN Salary, Montell D, MD      . insulin aspart (novoLOG) injection 0-5 Units  0-5 Units Subcutaneous QHS Salary, Montell D, MD      . insulin aspart (novoLOG) injection 0-9 Units  0-9 Units Subcutaneous TID WC Salary, Montell D, MD      . ipratropium-albuterol (DUONEB) 0.5-2.5 (3) MG/3ML nebulizer solution 3 mL  3 mL Nebulization Q4H PRN Salary, Montell D, MD      . loratadine (CLARITIN) tablet 10 mg  10 mg Oral Daily Salary, Montell D, MD   10 mg at 09/07/18 0909  . mometasone-formoterol (DULERA) 100-5 MCG/ACT inhaler 2 puff  2 puff Inhalation BID Angelina Ok D, MD   2 puff at 09/07/18 0907  . montelukast (SINGULAIR) tablet 10 mg  10 mg Oral QHS Salary, Jetty Duhamel D, MD   10 mg at 09/06/18 2108  . [START ON 09/08/2018] multivitamin with minerals tablet 1 tablet  1 tablet Oral Daily Enedina Finner, MD      . ondansetron (ZOFRAN) injection 4 mg  4 mg Intravenous Q6H PRN Salary, Montell D, MD      . pantoprazole (PROTONIX) EC tablet 40 mg  40 mg Oral Daily Salary, Montell D, MD   40 mg at 09/07/18 0909  . traZODone (DESYREL) tablet 50 mg  50 mg Oral QHS Seals, Milas Kocher, NP   50 mg at 09/06/18 2138      Allergies: Allergies  Allergen Reactions  . Penicillins Hives, Itching and Nausea And Vomiting  . Sulfa Antibiotics Hives, Itching and Nausea And Vomiting  . Aspirin Nausea And Vomiting and Nausea Only  . Tramadol Itching and Hives    hives hives hives  . Tramadol Hcl       Past Medical History: Past Medical History:  Diagnosis Date  . AF (atrial fibrillation) (HCC)   . Anemia   . Arthritis, rheumatoid (HCC)    . Asthma   .  Bruises easily   . Diabetes (HCC)   . Hearing loss   . History of swelling of feet   . Memory loss   . Muscle cramp   . Muscle pain   . Osteoporosis      Past Surgical History: Past Surgical History:  Procedure Laterality Date  . COLONOSCOPY    . EYE SURGERY Right   . GALLBLADDER SURGERY    . HIP SURGERY Bilateral   . KNEE SURGERY Right      Family History: No family history on file.   Social History: Social History   Socioeconomic History  . Marital status: Divorced    Spouse name: Not on file  . Number of children: Not on file  . Years of education: Not on file  . Highest education level: Not on file  Occupational History  . Not on file  Social Needs  . Financial resource strain: Not on file  . Food insecurity:    Worry: Not on file    Inability: Not on file  . Transportation needs:    Medical: Not on file    Non-medical: Not on file  Tobacco Use  . Smoking status: Former Games developermoker  . Smokeless tobacco: Never Used  Substance and Sexual Activity  . Alcohol use: Yes    Comment: social  . Drug use: No  . Sexual activity: Not on file  Lifestyle  . Physical activity:    Days per week: Patient refused    Minutes per session: Patient refused  . Stress: Not on file  Relationships  . Social connections:    Talks on phone: More than three times a week    Gets together: Once a week    Attends religious service: Not on file    Active member of club or organization: Not on file    Attends meetings of clubs or organizations: Not on file    Relationship status: Not on file  . Intimate partner violence:    Fear of current or ex partner: Not on file    Emotionally abused: Not on file    Physically abused: Not on file    Forced sexual activity: Not on file  Other Topics Concern  . Not on file  Social History Narrative  . Not on file     Review of Systems: Review of Systems  Constitutional: Positive for malaise/fatigue. Negative for fever.   HENT: Positive for hearing loss. Negative for congestion and tinnitus.   Eyes: Negative for blurred vision and double vision.  Respiratory: Negative for cough, hemoptysis and sputum production.   Cardiovascular: Negative for chest pain, palpitations and orthopnea.  Gastrointestinal: Negative for heartburn, nausea and vomiting.  Genitourinary: Negative for dysuria and urgency.  Musculoskeletal: Negative for falls and myalgias.  Skin: Negative for itching and rash.  Neurological: Positive for weakness. Negative for dizziness.  Endo/Heme/Allergies: Negative for polydipsia. Does not bruise/bleed easily.  Psychiatric/Behavioral: Negative for depression. The patient is not nervous/anxious.      Vital Signs: Blood pressure (!) 160/67, pulse 75, temperature 98.4 F (36.9 C), temperature source Oral, resp. rate 20, height 5\' 5"  (1.651 m), weight 65.3 kg, SpO2 99 %.  Weight trends: Filed Weights   09/06/18 1537  Weight: 65.3 kg    Physical Exam: General: NAD, laying in bed  Head: Normocephalic, atraumatic.  Eyes: Anicteric, EOMI  Nose: Mucous membranes moist, not inflammed, nonerythematous.  Throat: Oropharynx nonerythematous, no exudate appreciated.   Neck: Supple, trachea midline.  Lungs:  Normal respiratory  effort. Clear to auscultation BL without crackles or wheezes.  Heart: RRR. S1 and S2 normal without gallop, murmur, or rubs.  Abdomen:  BS normoactive. Soft, Nondistended, non-tender.  No masses or organomegaly.  Extremities: No pretibial edema.  Neurologic: A&O X3, Motor strength is 5/5 in the all 4 extremities  Skin: No visible rashes, scars.    Lab results: Basic Metabolic Panel: Recent Labs  Lab 09/06/18 1543 09/07/18 0730  NA 137 141  K 4.0 3.9  CL 102 112*  CO2 21* 20*  GLUCOSE 124* 102*  BUN 67* 52*  CREATININE 2.70* 1.68*  CALCIUM 9.4 8.4*    Liver Function Tests: Recent Labs  Lab 09/06/18 1543  AST 34  ALT 40  ALKPHOS 116  BILITOT 1.1  PROT 7.4   ALBUMIN 2.9*   Recent Labs  Lab 09/06/18 1543  LIPASE 25   No results for input(s): AMMONIA in the last 168 hours.  CBC: Recent Labs  Lab 09/06/18 1543  WBC 12.3*  NEUTROABS 8.9*  HGB 11.5*  HCT 34.6*  MCV 83.0  PLT 257    Cardiac Enzymes: No results for input(s): CKTOTAL, CKMB, CKMBINDEX, TROPONINI in the last 168 hours.  BNP: Invalid input(s): POCBNP  CBG: Recent Labs  Lab 09/06/18 2125 09/07/18 0731 09/07/18 1125  GLUCAP 84 93 105*    Microbiology: Results for orders placed or performed during the hospital encounter of 09/06/18  SARS Coronavirus 2 (CEPHEID - Performed in [facility] lab), Hosp Order     Status: None   Collection Time: 09/06/18  3:44 PM  Result Value Ref Range Status   SARS Coronavirus 2 NEGATIVE NEGATIVE Final    Comment: (NOTE) If result is NEGATIVE SARS-CoV-2 target nucleic acids are NOT DETECTED. The SARS-CoV-2 RNA is generally detectable in upper and lower  respiratory specimens during the acute phase of infection. The lowest  concentration of SARS-CoV-2 viral copies this assay can detect is 250  copies / mL. A negative result does not preclude SARS-CoV-2 infection  and should not be used as the sole basis for treatment or other  patient management decisions.  A negative result may occur with  improper specimen collection / handling, submission of specimen other  than nasopharyngeal swab, presence of viral mutation(s) within the  areas targeted by this assay, and inadequate number of viral copies  (<250 copies / mL). A negative result must be combined with clinical  observations, patient history, and epidemiological information. If result is POSITIVE SARS-CoV-2 target nucleic acids are DETECTED. The SARS-CoV-2 RNA is generally detectable in upper and lower  respiratory specimens dur ing the acute phase of infection.  Positive  results are indicative of active infection with SARS-CoV-2.  Clinical  correlation with patient  history and other diagnostic information is  necessary to determine patient infection status.  Positive results do  not rule out bacterial infection or co-infection with other viruses. If result is PRESUMPTIVE POSTIVE SARS-CoV-2 nucleic acids MAY BE PRESENT.   A presumptive positive result was obtained on the submitted specimen  and confirmed on repeat testing.  While 2019 novel coronavirus  (SARS-CoV-2) nucleic acids may be present in the submitted sample  additional confirmatory testing may be necessary for epidemiological  and / or clinical management purposes  to differentiate between  SARS-CoV-2 and other Sarbecovirus currently known to infect humans.  If clinically indicated additional testing with an alternate test  methodology [phone] is advised. The SARS-CoV-2 RNA is generally  detectable in upper and lower respiratory  sp ecimens during the acute  phase of infection. The expected result is Negative. Fact Sheet for Patients:  StrictlyIdeas.no Fact Sheet for Healthcare Providers: BankingDealers.co.za This test is not yet approved or cleared by the [place] FDA and has been authorized for detection and/or diagnosis of SARS-CoV-2 by FDA under an Emergency Use Authorization (EUA).  This EUA will remain in effect (meaning this test can be used) for the duration of the COVID-19 declaration under Section 564(b)(1) of the Act, 21 U.S.C. section 360bbb-3(b)(1), unless the authorization is terminated or revoked sooner. Performed at [facility], [address] [place], [place] [address]   Blood culture (routine x 2)     Status: None (Preliminary result)   Collection Time: 09/06/18  7:37 PM  Result Value Ref Range Status   Specimen Description BLOOD RIGHT ANTECUBITAL  Final   Special Requests   Final    BOTTLES DRAWN AEROBIC AND ANAEROBIC Blood Culture results may not be optimal due to an excessive volume of blood  received in culture bottles   Culture   Final    NO GROWTH < 12 HOURS Performed at [facility], [address] [place], [place] [address]    Report Status PENDING  Incomplete  Blood culture (routine x 2)     Status: None (Preliminary result)   Collection Time: 09/06/18  7:37 PM  Result Value Ref Range Status   Specimen Description BLOOD BLOOD RIGHT HAND  Final   Special Requests   Final    BOTTLES DRAWN AEROBIC AND ANAEROBIC Blood Culture results may not be optimal due to an excessive volume of blood received in culture bottles   Culture   Final    NO GROWTH < 12 HOURS Performed at [facility], [address], [place], [place] [address]    Report Status PENDING  Incomplete  MRSA PCR Screening     Status: None   Collection Time: 09/06/18  9:09 PM  Result Value Ref Range Status   MRSA by PCR NEGATIVE NEGATIVE Final    Comment:        The GeneXpert MRSA Assay (FDA approved for NASAL specimens only), is one component of a comprehensive MRSA colonization surveillance program. It is not intended to diagnose MRSA infection nor to guide or monitor treatment for MRSA infections. Performed at [facility], [address] [place],  [address]     Coagulation Studies: No results for input(s): LABPROT, INR in the last 72 hours.  Urinalysis: Recent Labs    09/06/18 1543  COLORURINE YELLOW*  LABSPEC 1.009  PHURINE 5.0  GLUCOSEU NEGATIVE  HGBUR NEGATIVE  BILIRUBINUR NEGATIVE  KETONESUR NEGATIVE  PROTEINUR NEGATIVE  NITRITE NEGATIVE  LEUKOCYTESUR NEGATIVE      Imaging: Ct Abdomen Pelvis Wo Contrast  Result Date: 09/06/2018 CLINICAL DATA:  Renal failure, shortness of breath. EXAM: CT ABDOMEN AND PELVIS WITHOUT CONTRAST TECHNIQUE: Multidetector CT imaging of the abdomen and pelvis was performed following the standard protocol without IV contrast. COMPARISON:  CT scan of September 22, 2007. FINDINGS: Lower chest: No acute abnormality.  Hepatobiliary: No focal liver abnormality is seen. No gallstones, gallbladder wall thickening, or biliary dilatation. Pancreas: Unremarkable. No pancreatic ductal dilatation or surrounding inflammatory changes. Spleen: Normal in size without focal abnormality. Adrenals/Urinary Tract: Stable left adrenal adenoma is noted. Right adrenal gland appears normal. No hydronephrosis or renal obstruction is noted. No renal or ureteral calculi are noted. Urinary bladder is unremarkable. Stomach/Bowel: Stomach is within normal limits. Appendix appears  normal. No evidence of bowel wall thickening, distention, or inflammatory changes. Diverticulosis of sigmoid colon is noted without inflammation. Vascular/Lymphatic: Aortic atherosclerosis. No enlarged abdominal or pelvic lymph nodes. Reproductive: Uterus and bilateral adnexa are unremarkable. Other: No abdominal wall hernia or abnormality. No abdominopelvic ascites. Musculoskeletal: Status post bilateral total hip arthroplasties. No acute osseous abnormality is noted. IMPRESSION: No acute abnormality seen in the abdomen or pelvis. Sigmoid diverticulosis without inflammation. Stable left adrenal adenoma. Aortic Atherosclerosis (ICD10-I70.0). Electronically Signed   By: Lupita Raider M.D.   On: 09/06/2018 18:56   US Renal  Result Date: 09/07/2018 CLINICAL DATA:  Acute renal failure EXAM: RENAL / URINARY TRACT ULTRASOUND COMPLETE COMPARISON:  CT abdomen pelvis 09/06/2018 FINDINGS: Right Kidney: Renal measurements: 11.1 x 3.9 x 4.2 cm = volume: 96 mL . Echogenicity within normal limits. No mass or hydronephrosis visualized. Left Kidney: Renal measurements: 10.8 x 5.2 x 4.8 cm = volume: 141 mL. Echogenicity within normal limits. No mass or hydronephrosis visualized. Bladder: Appears normal for degree of bladder distention. IMPRESSION: Negative renal ultrasound. Electronically Signed   By: Marlan Palau M.D.   On: 09/07/2018 10:34   Dg Chest Portable 1 View  Result Date:  09/06/2018 CLINICAL DATA:  Renal failure.  Shortness of breath. EXAM: PORTABLE CHEST 1 VIEW COMPARISON:  September 22, 2007 FINDINGS: Focal infiltrate is seen in the right upper lobe. Cardiomegaly. The hila and mediastinum are stable. No other interval changes. IMPRESSION: Focal infiltrate in the right upper lobe worrisome for pneumonia or aspiration. Recommend short-term follow-up to ensure resolution. Electronically Signed   By: Gerome Sam III M.D   On: 09/06/2018 16:12      Assessment & Plan: Pt is a 85 y.o. female with a PMHx of atrial fibrillation, rheumatoid arthritis, asthma, COPD, diabetes mellitus type 2, memory loss, osteoporosis, who was admitted to [facility] on 09/06/2018 for evaluation of acute renal failure.   1.  Acute renal failure with baseline creatinine of 0.8.  We suspect that the patient developed dehydration and volume depletion while at home as she has had rather poor p.o. intake as well as being on HCTZ.  She has a rather benign appearing urinalysis and also has a normal renal ultrasound.  Agree with IV fluid hydration at a moderate rate given prior congestive heart failure diagnosis.  Continue to monitor renal parameters daily and avoid nephrotoxins as possible.  2.  Hypertension.  Maintain the patient on diltiazem.  Avoid ACE inhibitor or ARB at the moment.

## 2018-09-07 NOTE — Progress Notes (Signed)
PT Cancellation Note  Patient Details Name: Tracy Acevedo MRN: [id number] DOB: [birth year]   Cancelled Treatment:    Reason Eval/Treat Not Completed: Other (comment) Chart reviewed. Nursing consulted. Patient with CNA on arrival for hygiene and preparing for breakfast. PT will follow up at a later time/date.  Myles Gip PT, DPT [phone] 09/07/2018, 9:03 AM

## 2018-09-07 NOTE — Progress Notes (Signed)
[facility] - [place] at [facility]   PATIENT NAME: Tracy Acevedo    MR#:  [id number]  DATE OF BIRTH:  04/23/1925  SUBJECTIVE:   Came in with generalized weakness poor PO intake not feeling well overall. REVIEW OF SYSTEMS:   Review of Systems  Constitutional: Negative for chills, fever and weight loss.  HENT: Negative for ear discharge, ear pain and nosebleeds.   Eyes: Negative for blurred vision, pain and discharge.  Respiratory: Positive for shortness of breath. Negative for sputum production, wheezing and stridor.   Cardiovascular: Negative for chest pain, palpitations, orthopnea and PND.  Gastrointestinal: Negative for abdominal pain, diarrhea, nausea and vomiting.  Genitourinary: Negative for frequency and urgency.  Musculoskeletal: Positive for back pain.  Neurological: Positive for weakness. Negative for sensory change, speech change and focal weakness.  Psychiatric/Behavioral: Negative for depression and hallucinations. The patient is not nervous/anxious.    Tolerating Diet:yes Tolerating PT: pending  DRUG ALLERGIES:   Allergies  Allergen Reactions  . Penicillins Hives, Itching and Nausea And Vomiting  . Sulfa Antibiotics Hives, Itching and Nausea And Vomiting  . Aspirin Nausea And Vomiting and Nausea Only  . Tramadol Itching and Hives    hives hives hives  . Tramadol Hcl     VITALS:  Blood pressure (!) 160/67, pulse 75, temperature 98.4 F (36.9 C), temperature source Oral, resp. rate 20, height 5\' 5"  (1.651 m), weight 65.3 kg, SpO2 99 %.  PHYSICAL EXAMINATION:   Physical Exam  GENERAL:  85 y.o.-year-old patient lying in the bed with no acute distress.  EYES: Pupils equal, round, reactive to light and accommodation. No scleral icterus. Extraocular muscles intact.  HEENT: Head atraumatic, normocephalic. Oropharynx and nasopharynx clear.  NECK:  Supple, no jugular venous distention. No thyroid enlargement, no tenderness.  LUNGS:  Normal breath sounds bilaterally, no wheezing, rales, rhonchi. No use of accessory muscles of respiration.  CARDIOVASCULAR: S1, S2 normal. No murmurs, rubs, or gallops.  ABDOMEN: Soft, nontender, nondistended. Bowel sounds present. No organomegaly or mass.  EXTREMITIES: No cyanosis, clubbing or edema b/l.    NEUROLOGIC: Cranial nerves II through XII are intact. No focal Motor or sensory deficits b/l.   PSYCHIATRIC:  patient is alert and oriented x 3.  SKIN: No obvious rash, lesion, or ulcer.   LABORATORY PANEL:  CBC Recent Labs  Lab 09/06/18 1543  WBC 12.3*  HGB 11.5*  HCT 34.6*  PLT 257    Chemistries  Recent Labs  Lab 09/06/18 1543 09/07/18 0730  NA 137 141  K 4.0 3.9  CL 102 112*  CO2 21* 20*  GLUCOSE 124* 102*  BUN 67* 52*  CREATININE 2.70* 1.68*  CALCIUM 9.4 8.4*  AST 34  --   ALT 40  --   ALKPHOS 116  --   BILITOT 1.1  --    Cardiac Enzymes No results for input(s): TROPONINI in the last 168 hours. RADIOLOGY:  Ct Abdomen Pelvis Wo Contrast  Result Date: 09/06/2018 CLINICAL DATA:  Renal failure, shortness of breath. EXAM: CT ABDOMEN AND PELVIS WITHOUT CONTRAST TECHNIQUE: Multidetector CT imaging of the abdomen and pelvis was performed following the standard protocol without IV contrast. COMPARISON:  CT scan of September 22, 2007. FINDINGS: Lower chest: No acute abnormality. Hepatobiliary: No focal liver abnormality is seen. No gallstones, gallbladder wall thickening, or biliary dilatation. Pancreas: Unremarkable. No pancreatic ductal dilatation or surrounding inflammatory changes. Spleen: Normal in size without focal abnormality. Adrenals/Urinary Tract: Stable left adrenal adenoma is noted. Right adrenal  gland appears normal. No hydronephrosis or renal obstruction is noted. No renal or ureteral calculi are noted. Urinary bladder is unremarkable. Stomach/Bowel: Stomach is within normal limits. Appendix appears normal. No evidence of bowel wall thickening, distention, or  inflammatory changes. Diverticulosis of sigmoid colon is noted without inflammation. Vascular/Lymphatic: Aortic atherosclerosis. No enlarged abdominal or pelvic lymph nodes. Reproductive: Uterus and bilateral adnexa are unremarkable. Other: No abdominal wall hernia or abnormality. No abdominopelvic ascites. Musculoskeletal: Status post bilateral total hip arthroplasties. No acute osseous abnormality is noted. IMPRESSION: No acute abnormality seen in the abdomen or pelvis. Sigmoid diverticulosis without inflammation. Stable left adrenal adenoma. Aortic Atherosclerosis (ICD10-I70.0). Electronically Signed   By: Lupita Raider M.D.   On: 09/06/2018 18:56   US Renal  Result Date: 09/07/2018 CLINICAL DATA:  Acute renal failure EXAM: RENAL / URINARY TRACT ULTRASOUND COMPLETE COMPARISON:  CT abdomen pelvis 09/06/2018 FINDINGS: Right Kidney: Renal measurements: 11.1 x 3.9 x 4.2 cm = volume: 96 mL . Echogenicity within normal limits. No mass or hydronephrosis visualized. Left Kidney: Renal measurements: 10.8 x 5.2 x 4.8 cm = volume: 141 mL. Echogenicity within normal limits. No mass or hydronephrosis visualized. Bladder: Appears normal for degree of bladder distention. IMPRESSION: Negative renal ultrasound. Electronically Signed   By: Marlan Palau M.D.   On: 09/07/2018 10:34   Dg Chest Portable 1 View  Result Date: 09/06/2018 CLINICAL DATA:  Renal failure.  Shortness of breath. EXAM: PORTABLE CHEST 1 VIEW COMPARISON:  September 22, 2007 FINDINGS: Focal infiltrate is seen in the right upper lobe. Cardiomegaly. The hila and mediastinum are stable. No other interval changes. IMPRESSION: Focal infiltrate in the right upper lobe worrisome for pneumonia or aspiration. Recommend short-term follow-up to ensure resolution. Electronically Signed   By: Gerome Sam III M.D   On: 09/06/2018 16:12   ASSESSMENT AND PLAN:   85 y.o. female with a PMHx of atrial fibrillation, rheumatoid arthritis, asthma, COPD, diabetes mellitus  type 2, memory loss, osteoporosis, who was admitted to [facility] on 09/06/2018 for evaluation of acute renal failure.  Patient recently went to see her primary care physician for blood work.  Upon this routine blood work she was found to have acute renal failure with a creatinine of 2.7  *Acute kidney injury secondary to poor PO intake for last several days and oral hydrochlorothiazide. -Baseline renal function  0.8 in dec 2019 -Avoid nephrotoxic agents, strict I&O monitoring, daily weights -nephrology input noted - gentle IV fluids rehydration, hold diuretics/lisinopril -came in with creatinine of 2.7-- 1.68  *Chronic atrial fibrillation Stable Hold digoxin given worsening renal status-check level in the morning, continue diltiazem, Pradaxa  *COPD without exacerbation -patient chest x-ray showed possible right upper lobe infiltrate versus fibrosis. -She has mild cough. White count is normal. -Will change to oral Ceftin which was prescribed to her as outpatient by PCP 1 day  -procalcitonin 0.59   *Chronic diabetes mellitus type 2 Stable Slight scale insulin with Accu-Cheks per routine  *Chronic GERD PPI daily  D/w dter on the phone CODE STATUS: full  DVT Prophylaxis: pradaxa  TOTAL TIME TAKING CARE OF THIS PATIENT: *30* minutes.  >50% time spent on counselling and coordination of care  POSSIBLE D/C IN **1-2 DAYS, DEPENDING ON CLINICAL CONDITION.  Note: This dictation was prepared with Dragon dictation along with smaller phrase technology. Any transcriptional errors that result from this process are unintentional.  Enedina Finner M.D on 09/07/2018 at 3:57 PM  Between 7am to 6pm - Pager - [phone]  After  6pm go to [email or web address] - password [email or web address] [facility]  Office  [phone]  CC: Primary care physician; Idelle Crouch, MDPatient ID: Tracy Acevedo, female   DOB: 1925-04-03, 85 y.o.   MRN: [id number]

## 2018-09-07 NOTE — Progress Notes (Signed)
PT Cancellation Note  Patient Details Name: Tracy Acevedo MRN: [id number] DOB: 1925/09/13   Cancelled Treatment:    Reason Eval/Treat Not Completed: Other (comment);Patient at procedure or test/unavailable Chart reviewed. Nursing consulted. Patient preparing for transport to [place]. PT will follow-up at a later time/date as appropriate.  Myles Gip PT, DPT [phone] 09/07/2018, 9:37 AM

## 2018-09-07 NOTE — Progress Notes (Signed)
PHARMACY NOTE:  ANTIMICROBIAL RENAL DOSAGE ADJUSTMENT  Current antimicrobial regimen includes a mismatch between antimicrobial dosage and estimated renal function.  As per policy approved by the Pharmacy & Therapeutics and Medical Executive Committees, the antimicrobial dosage will be adjusted accordingly.  Current antimicrobial dosage:  Cefuroxime 250 mg q12H   Indication: HAP  Renal Function:  Estimated Creatinine Clearance: 20 mL/min (A) (by C-G formula based on SCr of 1.68 mg/dL (H)).    Antimicrobial dosage has been changed to:  Cefuroxime 500 mg q24H   Thank you for allowing pharmacy to be a part of this patient's care.  Oswald Hillock, [facility] 09/07/2018 5:01 PM

## 2018-09-07 NOTE — Progress Notes (Signed)
Initial Nutrition Assessment  RD working remotely.  DOCUMENTATION CODES:   Not applicable  INTERVENTION:  Recommend liberalizing diet to carbohydrate modified.  Provide Glucerna Shake po TID, each supplement provides 220 kcal and 10 grams of protein.  Provide daily MVI.  NUTRITION DIAGNOSIS:   Inadequate oral intake related to decreased appetite as evidenced by 5.9% weight loss over 2 months.  GOAL:   Patient will meet greater than or equal to 90% of their needs  MONITOR:   PO intake, Supplement acceptance, Labs, Weight trends, I & O's  REASON FOR ASSESSMENT:   Consult Assessment of nutrition requirement/status  ASSESSMENT:   85 year old female with PMHx of memory loss, hearing loss, RA, OP, DM, A-fib, GERD, asthma admitted with PNA, AKI.   Attempted to call patient for nutrition/weight history but she was unable to answer the phone. She is currently on a restrictive diet and would benefit from diet liberalization. No meal completion documented at this time so unsure how she is eating at meals. Skin is intact.  Limited weight history in chart. She was 73.5 kg in 2014. On 06/29/2017 patient was 69.4 kg. She is now 65.3 kg (144 lbs). She has lost 4.1 kg (5.9% body weight) over the past 2 months, which is not quite significant for time frame, but still concerning.  Medications reviewed and include: acidophilus 1 capsule daily, Novolog 0-9 units TID, Novolog 0-5 units QHS, pantoprazole, NS at 50 mL/hr.  Labs reviewed: CBG 93-105, Chloride 112, CO2 20, BUN 52, Creatinine 1.68. HgbA1c 5.9 on 6/8.  NUTRITION - FOCUSED PHYSICAL EXAM:  Unable to complete at this time.  Diet Order:   Diet Order            Diet heart healthy/carb modified Room service appropriate? Yes; Fluid consistency: Thin  Diet effective now             EDUCATION NEEDS:   No education needs have been identified at this time  Skin:  Skin Assessment: Reviewed RN Assessment  Last BM:  09/05/2018 per  chart  Height:   Ht Readings from Last 1 Encounters:  09/06/18 5\' 5"  (1.651 m)   Weight:   Wt Readings from Last 1 Encounters:  09/06/18 65.3 kg   Ideal Body Weight:  56.8 kg  BMI:  Body mass index is 23.96 kg/m.  Estimated Nutritional Needs:   Kcal:  1500-1700  Protein:  75-85 grams  Fluid:  1.5-1.7 L/day  Willey Blade, MS, RD, LDN Office: [phone] Pager: [phone] After Hours/Weekend Pager: [phone]

## 2018-09-08 LAB — BASIC METABOLIC PANEL
Anion gap: 10 (ref 5–15)
BUN: 41 mg/dL — ABNORMAL HIGH (ref 8–23)
CO2: 22 mmol/L (ref 22–32)
Calcium: 8.3 mg/dL — ABNORMAL LOW (ref 8.9–10.3)
Chloride: 108 mmol/L (ref 98–111)
Creatinine, Ser: 1.2 mg/dL — ABNORMAL HIGH (ref 0.44–1.00)
GFR calc Af Amer: 46 mL/min — ABNORMAL LOW (ref >60)
GFR calc non Af Amer: 40 mL/min — ABNORMAL LOW (ref >60)
Glucose, Bld: 111 mg/dL — ABNORMAL HIGH (ref 70–99)
Potassium: 3.5 mmol/L (ref 3.5–5.1)
Sodium: 140 mmol/L (ref 135–145)

## 2018-09-08 LAB — GLUCOSE, CAPILLARY
Glucose-Capillary: 97 mg/dL (ref 70–99)
Glucose-Capillary: 91 mg/dL (ref 70–99)
Glucose-Capillary: 101 mg/dL — ABNORMAL HIGH (ref 70–99)
Glucose-Capillary: 106 mg/dL — ABNORMAL HIGH (ref 70–99)

## 2018-09-08 LAB — HIV ANTIBODY (ROUTINE TESTING W REFLEX): HIV Screen 4th Generation wRfx: NONREACTIVE

## 2018-09-08 MED ORDER — PREDNISONE 50 MG PO TABS: 50.0000 mg | ORAL_TABLET | Freq: Every day | ORAL | Status: DC

## 2018-09-08 NOTE — Progress Notes (Signed)
Patient complains of ".. I'm on the verge of having an asthma attack".  Talking in full sentences with out pause or gasping.  Duoneb given, encouraged to take good deep breaths.  Found to have a home medication "rescue" inhaler in hand, placed in medication bin in medication room, to be returned upon discharge.  Stated that she felt like she was having a "stress attack".  I feel guilty asking for anything.  Encouraged her to call for assistance and anything she needed, that is was OK to ask, that we wanted her to ask.  Blanket given and room heat turned up.  Stated that the Duoneb helped her feel a little better.  Barbaraann Faster, RN 9:03 PM,09/08/2018

## 2018-09-08 NOTE — Progress Notes (Signed)
[facility] - [place] at [facility]   PATIENT NAME: Tracy Acevedo    MR#:  [id number]  DATE OF BIRTH:  1926/02/27  SUBJECTIVE:   Feels weak overall doing much better. Eating better. REVIEW OF SYSTEMS:   Review of Systems  Constitutional: Negative for chills, fever and weight loss.  HENT: Negative for ear discharge, ear pain and nosebleeds.   Eyes: Negative for blurred vision, pain and discharge.  Respiratory: Positive for shortness of breath. Negative for sputum production, wheezing and stridor.   Cardiovascular: Negative for chest pain, palpitations, orthopnea and PND.  Gastrointestinal: Negative for abdominal pain, diarrhea, nausea and vomiting.  Genitourinary: Negative for frequency and urgency.  Musculoskeletal: Positive for back pain.  Neurological: Positive for weakness. Negative for sensory change, speech change and focal weakness.  Psychiatric/Behavioral: Negative for depression and hallucinations. The patient is not nervous/anxious.    Tolerating Diet:yes Tolerating PT home health physical therapy  DRUG ALLERGIES:   Allergies  Allergen Reactions  . Penicillins Hives, Itching and Nausea And Vomiting  . Sulfa Antibiotics Hives, Itching and Nausea And Vomiting  . Aspirin Nausea And Vomiting and Nausea Only  . Tramadol Itching and Hives    hives hives hives  . Tramadol Hcl     VITALS:  Blood pressure (!) 149/61, pulse 60, temperature 98.3 F (36.8 C), temperature source Oral, resp. rate 18, height 5\' 5"  (1.651 m), weight 65.3 kg, SpO2 96 %.  PHYSICAL EXAMINATION:   Physical Exam  GENERAL:  85 y.o.-year-old patient lying in the bed with no acute distress.  EYES: Pupils equal, round, reactive to light and accommodation. No scleral icterus. Extraocular muscles intact.  HEENT: Head atraumatic, normocephalic. Oropharynx and nasopharynx clear.  NECK:  Supple, no jugular venous distention. No thyroid enlargement, no tenderness.  LUNGS:  distant breath sounds bilaterally, no wheezing, rales, rhonchi. No use of accessory muscles of respiration.  CARDIOVASCULAR: S1, S2 normal. No murmurs, rubs, or gallops.  ABDOMEN: Soft, nontender, nondistended. Bowel sounds present. No organomegaly or mass.  EXTREMITIES: No cyanosis, clubbing or edema b/l.    NEUROLOGIC: Cranial nerves II through XII are intact. No focal Motor or sensory deficits b/l.  weak PSYCHIATRIC:  patient is alert and oriented x 3.  SKIN: No obvious rash, lesion, or ulcer.   LABORATORY PANEL:  CBC Recent Labs  Lab 09/06/18 1543  WBC 12.3*  HGB 11.5*  HCT 34.6*  PLT 257    Chemistries  Recent Labs  Lab 09/06/18 1543  09/08/18 0321  NA 137   < > 140  K 4.0   < > 3.5  CL 102   < > 108  CO2 21*   < > 22  GLUCOSE 124*   < > 111*  BUN 67*   < > 41*  CREATININE 2.70*   < > 1.20*  CALCIUM 9.4   < > 8.3*  AST 34  --   --   ALT 40  --   --   ALKPHOS 116  --   --   BILITOT 1.1  --   --    < > = values in this interval not displayed.   Cardiac Enzymes No results for input(s): TROPONINI in the last 168 hours. RADIOLOGY:  Ct Abdomen Pelvis Wo Contrast  Result Date: 09/06/2018 CLINICAL DATA:  Renal failure, shortness of breath. EXAM: CT ABDOMEN AND PELVIS WITHOUT CONTRAST TECHNIQUE: Multidetector CT imaging of the abdomen and pelvis was performed following the standard protocol without IV contrast. COMPARISON:  CT scan of September 22, 2007. FINDINGS: Lower chest: No acute abnormality. Hepatobiliary: No focal liver abnormality is seen. No gallstones, gallbladder wall thickening, or biliary dilatation. Pancreas: Unremarkable. No pancreatic ductal dilatation or surrounding inflammatory changes. Spleen: Normal in size without focal abnormality. Adrenals/Urinary Tract: Stable left adrenal adenoma is noted. Right adrenal gland appears normal. No hydronephrosis or renal obstruction is noted. No renal or ureteral calculi are noted. Urinary bladder is unremarkable.  Stomach/Bowel: Stomach is within normal limits. Appendix appears normal. No evidence of bowel wall thickening, distention, or inflammatory changes. Diverticulosis of sigmoid colon is noted without inflammation. Vascular/Lymphatic: Aortic atherosclerosis. No enlarged abdominal or pelvic lymph nodes. Reproductive: Uterus and bilateral adnexa are unremarkable. Other: No abdominal wall hernia or abnormality. No abdominopelvic ascites. Musculoskeletal: Status post bilateral total hip arthroplasties. No acute osseous abnormality is noted. IMPRESSION: No acute abnormality seen in the abdomen or pelvis. Sigmoid diverticulosis without inflammation. Stable left adrenal adenoma. Aortic Atherosclerosis (ICD10-I70.0). Electronically Signed   By: Marijo Conception M.D.   On: 09/06/2018 18:56   US Renal  Result Date: 09/07/2018 CLINICAL DATA:  Acute renal failure EXAM: RENAL / URINARY TRACT ULTRASOUND COMPLETE COMPARISON:  CT abdomen pelvis 09/06/2018 FINDINGS: Right Kidney: Renal measurements: 11.1 x 3.9 x 4.2 cm = volume: 96 mL . Echogenicity within normal limits. No mass or hydronephrosis visualized. Left Kidney: Renal measurements: 10.8 x 5.2 x 4.8 cm = volume: 141 mL. Echogenicity within normal limits. No mass or hydronephrosis visualized. Bladder: Appears normal for degree of bladder distention. IMPRESSION: Negative renal ultrasound. Electronically Signed   By: Franchot Gallo M.D.   On: 09/07/2018 10:34   Dg Chest Portable 1 View  Result Date: 09/06/2018 CLINICAL DATA:  Renal failure.  Shortness of breath. EXAM: PORTABLE CHEST 1 VIEW COMPARISON:  September 22, 2007 FINDINGS: Focal infiltrate is seen in the right upper lobe. Cardiomegaly. The hila and mediastinum are stable. No other interval changes. IMPRESSION: Focal infiltrate in the right upper lobe worrisome for pneumonia or aspiration. Recommend short-term follow-up to ensure resolution. Electronically Signed   By: Dorise Bullion III M.D   On: 09/06/2018 16:12    ASSESSMENT AND PLAN:   85 y.o. female with a PMHx of atrial fibrillation, rheumatoid arthritis, asthma, COPD, diabetes mellitus type 2, memory loss, osteoporosis, who was admitted to [facility] on 09/06/2018 for evaluation of acute renal failure.  Patient recently went to see her primary care physician for blood work.  Upon this routine blood work she was found to have acute renal failure with a creatinine of 2.7  *Acute kidney injury secondary to poor PO intake for last several days and oral hydrochlorothiazide. -Baseline renal function  0.8 in dec 2019 -Avoid nephrotoxic agents -nephrology input noted - gentle IV fluids rehydration, hold diuretics/lisinopril -came in with creatinine of 2.7-- 1.68--1.20  *Chronic atrial fibrillation -Hold digoxin given worsening renal status - continue diltiazem, Pradaxa  *COPD without exacerbation -patient chest x-ray showed possible right upper lobe infiltrate versus fibrosis. -She has mild cough. White count is normal. -Will change to oral Ceftin which was prescribed to her as outpatient by PCP 1 day  -procalcitonin 0.59 -short steroid taper--pt reported some wheezing  *diabetes mellitus type 2 Slight scale insulin with Accu-Cheks per routine  *Chronic GERD PPI daily  D/w dter on the phone Elenore Rota [phone]  CODE STATUS: full  DVT Prophylaxis: pradaxa  TOTAL TIME TAKING CARE OF THIS PATIENT: *30* minutes.  >50% time spent on counselling and coordination of care  POSSIBLE  D/C tomorrow DEPENDING ON CLINICAL CONDITION.  Note: This dictation was prepared with Dragon dictation along with smaller phrase technology. Any transcriptional errors that result from this process are unintentional.  Enedina Finner M.D on 09/08/2018 at 2:48 PM  Between 7am to 6pm - Pager - [phone]  After 6pm go to [email or web address] - Social research officer, government  [facility]  Office  [phone]  CC: Primary care physician; Marguarite Arbour,  MDPatient ID: Tracy Acevedo, female   DOB: 1925/11/18, 85 y.o.   MRN: [id number]

## 2018-09-08 NOTE — Evaluation (Signed)
Physical Therapy Evaluation Patient Details Name: Tracy Acevedo MRN: [id number] DOB: [birth year] Today's Date: 09/08/2018   History of Present Illness  presented to ER secondary to abnormal lab-work; admitted for management of HCAP, AKI.  Clinical Impression  Upon evaluation, patient alert and oriented; follows commands. Eager for OOB attempts.  Bilat UE/LE strength and ROM grossly symmetrical and WFL; appropriate for basic transfers and gait.  Denies pain.  Prefers all gait/mobility efforts with shoes donned (has Birkenstocks from home).  Able to complete sit/stand, basic transfers and gait (50') with RW, cga/close sup.  Narrowed BOS with excessive R LE IR; forward trunk flexion; slow, but fairly steady, without buckling or LOB.  Mod SOB, though sats >94% on RA.  Patient reports performance, distance feels near baseline for her. Mod SOB with exertion, though sats remain >94% on RA at rest and with exertion.  Will continue to educate regarding energy conservation, activity pacing.  Recommending OT [facility] services at discharge to address. Would benefit from skilled PT to address above deficits and promote optimal return to PLOF; Recommend transition to [facility] upon discharge from acute hospitalization.     Follow Up Recommendations Home health PT    Equipment Recommendations  (has 4WRW, BSC, TTB)    Recommendations for Other Services   OT consult/HH    Precautions / Restrictions Precautions Precautions: Fall Restrictions Weight Bearing Restrictions: No      Mobility  Bed Mobility               General bed mobility comments: seated edge of bed arrival/end of session  Transfers Overall transfer level: Needs assistance Equipment used: Rolling walker (2 wheeled) Transfers: Sit to/from Stand Sit to Stand: Min guard         General transfer comment: increased time/effort, uses posterior surface of bilat UEs to brace against edge of bed  Ambulation/Gait Ambulation/Gait  assistance: Min guard Gait Distance (Feet): 50 Feet Assistive device: Rolling walker (2 wheeled)       General Gait Details: narrowed BOS with excessive R LE IR; forward trunk flexion; slow, but fairly steady, without buckling or LOB.  Mod SOB, though sats >94% on RA.  Patient reports performance, distance feels near baseline for her.  Stairs            Wheelchair Mobility    Modified Rankin (Stroke Patients Only)       Balance Overall balance assessment: Needs assistance Sitting-balance support: No upper extremity supported;Feet supported Sitting balance-Leahy Scale: Good     Standing balance support: Bilateral upper extremity supported Standing balance-Leahy Scale: Fair                               Pertinent Vitals/Pain Pain Assessment: No/denies pain    Home Living Family/patient expects to be discharged to:: (Indep living facility)                      Prior Function Level of Independence: Independent with assistive device(s)         Comments: Mod indep with toileting, household distanes; has aide 1x/week for ADLs and household chores; was actively participating with HHPT/OT prior to admission.  Children making arrangements for meal assist     Hand Dominance        Extremity/Trunk Assessment   Upper Extremity Assessment Upper Extremity Assessment: Overall WFL for tasks assessed    Lower Extremity Assessment Lower Extremity Assessment: Generalized weakness(grossly 4-/5  throughout)       Communication   Communication: HOH  Cognition Arousal/Alertness: Awake/alert Behavior During Therapy: WFL for tasks assessed/performed Overall Cognitive Status: Within Functional Limits for tasks assessed                                        General Comments      Exercises Other Exercises Other Exercises: Patient with multiple questions about home environment, DME, therapy needs.  Talked through all questions and  answered to patient's satisfaction.  Comfortable with upcoming discharge.   Assessment/Plan    PT Assessment Patient needs continued PT services  PT Problem List Decreased strength;Decreased activity tolerance;Decreased mobility;Decreased balance;Decreased coordination;Decreased cognition;Decreased safety awareness;Decreased knowledge of precautions;Cardiopulmonary status limiting activity       PT Treatment Interventions DME instruction;Gait training;Stair training;Functional mobility training;Therapeutic activities;Therapeutic exercise;Balance training;Patient/family education    PT Goals (Current goals can be found in the Care Plan section)  Acute Rehab PT Goals Patient Stated Goal: to return home PT Goal Formulation: With patient Time For Goal Achievement: 09/22/18 Potential to Achieve Goals: Good    Frequency Min 2X/week   Barriers to discharge Decreased caregiver support      Co-evaluation               AM-PAC PT "6 Clicks" Mobility  Outcome Measure Help needed turning from your back to your side while in a flat bed without using bedrails?: None Help needed moving from lying on your back to sitting on the side of a flat bed without using bedrails?: None Help needed moving to and from a bed to a chair (including a wheelchair)?: A Little Help needed standing up from a chair using your arms (e.g., wheelchair or bedside chair)?: A Little Help needed to walk in hospital room?: A Little Help needed climbing 3-5 steps with a railing? : A Little 6 Click Score: 20    End of Session Equipment Utilized During Treatment: Gait belt Activity Tolerance: Patient tolerated treatment well Patient left: in bed;with call bell/phone within reach;with bed alarm set Nurse Communication: Mobility status PT Visit Diagnosis: Muscle weakness (generalized) (M62.81);Difficulty in walking, not elsewhere classified (R26.2)    Time: 1201-1227 PT Time Calculation (min) (ACUTE ONLY): 26  min   Charges:   PT Evaluation $PT Eval Moderate Complexity: 1 Mod PT Treatments $Therapeutic Activity: 8-22 mins        Karo Rog H. Manson Passey, PT, DPT, NCS 09/08/18, 1:39 PM [phone]

## 2018-09-09 LAB — GLUCOSE, CAPILLARY
Glucose-Capillary: 97 mg/dL (ref 70–99)
Glucose-Capillary: 107 mg/dL — ABNORMAL HIGH (ref 70–99)
Glucose-Capillary: 178 mg/dL — ABNORMAL HIGH (ref 70–99)

## 2018-09-09 LAB — LEGIONELLA PNEUMOPHILA SEROGP 1 UR AG: L. pneumophila Serogp 1 Ur Ag: NEGATIVE

## 2018-09-09 MED ORDER — GLUCERNA SHAKE PO LIQD: 237.0000 mL | Freq: Three times a day (TID) | ORAL | 0 refills | Status: AC

## 2018-09-09 MED ORDER — PREDNISONE 10 MG PO TABS: 40.0000 mg | ORAL_TABLET | Freq: Every day | ORAL | 0 refills | Status: DC

## 2018-09-09 MED ORDER — PREDNISONE 20 MG PO TABS: 40.0000 mg | ORAL_TABLET | Freq: Every day | ORAL | Status: DC

## 2018-09-09 MED ORDER — PREDNISONE 20 MG PO TABS
40.0000 mg | ORAL_TABLET | Freq: Every day | ORAL | Status: DC
  Administered 2018-09-09: 40 mg via ORAL
  Filled 2018-09-09: qty 2

## 2018-09-09 NOTE — Progress Notes (Signed)
[facility]  ROUNDING NOTE   Subjective:  Patient significantly improved. Creatinine down to 1.2. Has much better appetite now.   Objective:  Vital signs in last 24 hours:  Temp:  [97.6 F (36.4 C)-98.3 F (36.8 C)] 98.3 F (36.8 C) (06/11 1208) Pulse Rate:  [64-75] 65 (06/11 1208) Resp:  [18] 18 (06/11 1208) BP: (125-168)/(48-90) 168/90 (06/11 1208) SpO2:  [95 %-98 %] 96 % (06/11 1208)  Weight change:  Filed Weights   09/06/18 1537  Weight: 65.3 kg    Intake/Output: I/O last 3 completed shifts: In: 1315.5 [P.O.:400; I.V.:915.5] Out: 1250 [Urine:1250]   Intake/Output this shift:  No intake/output data recorded.  Physical Exam: General: No acute distress  Head: Normocephalic, atraumatic. Moist oral mucosal membranes  Eyes: Anicteric  Neck: Supple, trachea midline  Lungs:  Clear to auscultation, normal effort  Heart: S1S2 no rubs  Abdomen:  Soft, nontender, bowel sounds present  Extremities: No peripheral edema.  Neurologic: Awake, alert, following commands  Skin: No lesions       Basic Metabolic Panel: Recent Labs  Lab 09/06/18 1543 09/07/18 0730 09/08/18 0321  NA 137 141 140  K 4.0 3.9 3.5  CL 102 112* 108  CO2 21* 20* 22  GLUCOSE 124* 102* 111*  BUN 67* 52* 41*  CREATININE 2.70* 1.68* 1.20*  CALCIUM 9.4 8.4* 8.3*    Liver Function Tests: Recent Labs  Lab 09/06/18 1543  AST 34  ALT 40  ALKPHOS 116  BILITOT 1.1  PROT 7.4  ALBUMIN 2.9*   Recent Labs  Lab 09/06/18 1543  LIPASE 25   No results for input(s): AMMONIA in the last 168 hours.  CBC: Recent Labs  Lab 09/06/18 1543  WBC 12.3*  NEUTROABS 8.9*  HGB 11.5*  HCT 34.6*  MCV 83.0  PLT 257    Cardiac Enzymes: No results for input(s): CKTOTAL, CKMB, CKMBINDEX, TROPONINI in the last 168 hours.  BNP: Invalid input(s): POCBNP  CBG: Recent Labs  Lab 09/08/18 1124 09/08/18 1639 09/08/18 2126 09/09/18 0800 09/09/18 1206  GLUCAP 91 101* 106* 97 107*     Microbiology: Results for orders placed or performed during the hospital encounter of 09/06/18  SARS Coronavirus 2 (CEPHEID - Performed in [facility] hospital lab), Hosp Order     Status: None   Collection Time: 09/06/18  3:44 PM   Specimen: Nasopharyngeal Swab  Result Value Ref Range Status   SARS Coronavirus 2 NEGATIVE NEGATIVE Final    Comment: (NOTE) If result is NEGATIVE SARS-CoV-2 target nucleic acids are NOT DETECTED. The SARS-CoV-2 RNA is generally detectable in upper and lower  respiratory specimens during the acute phase of infection. The lowest  concentration of SARS-CoV-2 viral copies this assay can detect is 250  copies / mL. A negative result does not preclude SARS-CoV-2 infection  and should not be used as the sole basis for treatment or other  patient management decisions.  A negative result may occur with  improper specimen collection / handling, submission of specimen other  than nasopharyngeal swab, presence of viral mutation(s) within the  areas targeted by this assay, and inadequate number of viral copies  (<250 copies / mL). A negative result must be combined with clinical  observations, patient history, and epidemiological information. If result is POSITIVE SARS-CoV-2 target nucleic acids are DETECTED. The SARS-CoV-2 RNA is generally detectable in upper and lower  respiratory specimens dur ing the acute phase of infection.  Positive  results are indicative of active infection with  SARS-CoV-2.  Clinical  correlation with patient history and other diagnostic information is  necessary to determine patient infection status.  Positive results do  not rule out bacterial infection or co-infection with other viruses. If result is PRESUMPTIVE POSTIVE SARS-CoV-2 nucleic acids MAY BE PRESENT.   A presumptive positive result was obtained on the submitted specimen  and confirmed on repeat testing.  While 2019 novel coronavirus  (SARS-CoV-2) nucleic acids may be  present in the submitted sample  additional confirmatory testing may be necessary for epidemiological  and / or clinical management purposes  to differentiate between  SARS-CoV-2 and other Sarbecovirus currently known to infect humans.  If clinically indicated additional testing with an alternate test  methodology [phone]) is advised. The SARS-CoV-2 RNA is generally  detectable in upper and lower respiratory sp ecimens during the acute  phase of infection. The expected result is Negative. Fact Sheet for Patients:  BoilerBrush.com.cy Fact Sheet for Healthcare Providers: https://pope.com/ This test is not yet approved or cleared by the [place] FDA and has been authorized for detection and/or diagnosis of SARS-CoV-2 by FDA under an Emergency Use Authorization (EUA).  This EUA will remain in effect (meaning this test can be used) for the duration of the COVID-19 declaration under Section 564(b)(1) of the Act, 21 U.S.C. section 360bbb-3(b)(1), unless the authorization is terminated or revoked sooner. Performed at [facility], [address] [place], [place] [address]   Blood culture (routine x 2)     Status: None (Preliminary result)   Collection Time: 09/06/18  7:37 PM   Specimen: BLOOD  Result Value Ref Range Status   Specimen Description BLOOD RIGHT ANTECUBITAL  Final   Special Requests   Final    BOTTLES DRAWN AEROBIC AND ANAEROBIC Blood Culture results may not be optimal due to an excessive volume of blood received in culture bottles   Culture   Final    NO GROWTH 3 DAYS Performed at [facility], [address] [place], [place] [address]    Report Status PENDING  Incomplete  Blood culture (routine x 2)     Status: None (Preliminary result)   Collection Time: 09/06/18  7:37 PM   Specimen: BLOOD  Result Value Ref Range Status   Specimen Description BLOOD BLOOD RIGHT HAND  Final   Special Requests    Final    BOTTLES DRAWN AEROBIC AND ANAEROBIC Blood Culture results may not be optimal due to an excessive volume of blood received in culture bottles   Culture   Final    NO GROWTH 3 DAYS Performed at [facility], [address] [place], [place] [address]    Report Status PENDING  Incomplete  MRSA PCR Screening     Status: None   Collection Time: 09/06/18  9:09 PM   Specimen: Nasopharyngeal  Result Value Ref Range Status   MRSA by PCR NEGATIVE NEGATIVE Final    Comment:        The GeneXpert MRSA Assay (FDA approved for NASAL specimens only), is one component of a comprehensive MRSA colonization surveillance program. It is not intended to diagnose MRSA infection nor to guide or monitor treatment for MRSA infections. Performed at [facility], [address] [place], [place] [address]     Coagulation Studies: No results for input(s): LABPROT, INR in the last 72 hours.  Urinalysis: No results for input(s): COLORURINE, LABSPEC, PHURINE, GLUCOSEU, HGBUR, BILIRUBINUR, KETONESUR, PROTEINUR, UROBILINOGEN, NITRITE, LEUKOCYTESUR in the last 72 hours.  Invalid input(s):  APPERANCEUR    Imaging: No results found.   Medications:    . acidophilus  1 capsule Oral Daily  . cefUROXime  500 mg Oral Daily  . dabigatran  75 mg Oral Q12H  . diltiazem  360 mg Oral Daily  . feeding supplement (GLUCERNA SHAKE)  237 mL Oral TID BM  . insulin aspart  0-5 Units Subcutaneous QHS  . insulin aspart  0-9 Units Subcutaneous TID WC  . loratadine  10 mg Oral Daily  . mometasone-formoterol  2 puff Inhalation BID  . montelukast  10 mg Oral QHS  . multivitamin with minerals  1 tablet Oral Daily  . pantoprazole  40 mg Oral Daily  . predniSONE  40 mg Oral Q breakfast  . traZODone  50 mg Oral QHS   albuterol, fluticasone, ipratropium-albuterol, ondansetron (ZOFRAN) IV  Assessment/ Plan:  85 y.o. female with a PMHx of atrial fibrillation, rheumatoid arthritis, asthma, COPD,  diabetes mellitus type 2, memory loss, osteoporosis, who was admitted to [facility] on 09/06/2018 for evaluation of acute renal failure.   1.  Acute renal failure with baseline creatinine of 0.8.  We suspect that the patient developed dehydration and volume depletion while at home as she has had rather poor p.o. intake as well as being on HCTZ.   -Renal function significantly improved.  Creatinine down to 1.2.  Good urine output noted.  Encourage the patient to maintain adequate fluid hydration status.  She verbalized understanding of this.  2.  Hypertension.  Continue current dosage of diltiazem at this time.   LOS: 3 Svara Twyman 6/11/20203:48 PM

## 2018-09-09 NOTE — Progress Notes (Signed)
Patient discharged to home via EMS.VSS. Belongings sent with patient.Discharge instructions reviewed with patient. IV removed. All questions answered to patient satisfaction.

## 2018-09-09 NOTE — Progress Notes (Signed)
Attempted to call patients daughter, but no one answered

## 2018-09-09 NOTE — Care Management Important Message (Signed)
Important Message  Patient Details  Name: Tracy Acevedo MRN: [id number] Date of Birth: 1925/06/11   Medicare Important Message Given:  Yes    Dannette Gwenlyn 09/09/2018, 1:13 PM

## 2018-09-09 NOTE — Discharge Summary (Signed)
[facility] - [place] [facility]   PATIENT NAME: Tracy Acevedo    MR#:  [id number]  DATE OF BIRTH:  31-Jan-1926  DATE OF ADMISSION:  09/06/2018 ADMITTING PHYSICIAN: Evelena Asa Salary, MD  DATE OF DISCHARGE: 09/09/2018  PRIMARY CARE PHYSICIAN: Marguarite Arbour, MD    ADMISSION DIAGNOSIS:  ARF (acute renal failure) (HCC) [N17.9] AKI (acute kidney injury) (HCC) [N17.9] Pneumonia of right upper lobe due to infectious organism (HCC) [J18.1]  DISCHARGE DIAGNOSIS:  acute renal failure secondary to dehydration from poor PO intake chronic emphysema/asthma  SECONDARY DIAGNOSIS:   Past Medical History:  Diagnosis Date  . AF (atrial fibrillation) (HCC)   . Anemia   . Arthritis, rheumatoid (HCC)   . Asthma   . Bruises easily   . Diabetes (HCC)   . Hearing loss   . History of swelling of feet   . Memory loss   . Muscle cramp   . Muscle pain   . Osteoporosis     HOSPITAL COURSE:   85 y.o.femalewith a PMHx of atrial fibrillation, rheumatoid arthritis, asthma, COPD, diabetes mellitus type 2, memory loss, osteoporosis, who was admitted to [facility] on6/8/2020for evaluation of acute renal failure. Patient recently went to see her primary care physician for blood work. Upon this routine blood work she was found to have acute renal failure with a creatinine of 2.7  *Acute kidney injury secondary to poor PO intake for last several days and oral hydrochlorothiazide. -Baseline renal function  0.8 in dec 2019 -Avoid nephrotoxic agents -nephrology input noted -received  gentle IV fluids rehydration, -resumed lisinopril -d/c HCTZ (pt wa son low dose) -came in with creatinine of 2.7-- 1.68--1.20  *Chronic atrial fibrillation -resume digoxin - continue diltiazem, Pradaxa  *COPD without exacerbation -patient chest x-ray showed possible right upper lobe infiltrate versus fibrosis. -She has mild cough. White count is normal. -Will change to oral Ceftin which  was prescribed to her as outpatient by PCP  -procalcitonin 0.59 -short steroid taper--pt reported some wheezing  *diabetes mellitus type 2 Slight scale insulin with Accu-Cheks per routine  *Chronic GERD PPI daily  D/w dter on the phone Lauris Chroman [phone] on June 10th  Pt will discharge home with [facility] services. CM for d/c planning  CODE STATUS: full CONSULTS OBTAINED:  Treatment Team:  Mady Haagensen, MD  DRUG ALLERGIES:   Allergies  Allergen Reactions  . Penicillins Hives, Itching and Nausea And Vomiting  . Sulfa Antibiotics Hives, Itching and Nausea And Vomiting  . Aspirin Nausea And Vomiting and Nausea Only  . Tramadol Itching and Hives    hives hives hives  . Tramadol Hcl     DISCHARGE MEDICATIONS:   Allergies as of 09/09/2018      Reactions   Penicillins Hives, Itching, Nausea And Vomiting   Sulfa Antibiotics Hives, Itching, Nausea And Vomiting   Aspirin Nausea And Vomiting, Nausea Only   Tramadol Itching, Hives   hives hives hives   Tramadol Hcl       Medication List    STOP taking these medications   hydrochlorothiazide 12.5 MG tablet Commonly known as: HYDRODIURIL     TAKE these medications   BIOTIN PO Take by mouth daily. Dose unknown   CALTRATE 600+D PO Take 1 tablet by mouth daily.   cefUROXime 250 MG tablet Commonly known as: CEFTIN Take 250 mg by mouth 2 (two) times a day.   cetirizine 10 MG tablet Commonly known as: ZYRTEC Take 10 mg by mouth daily.  dabigatran 150 MG Caps capsule Commonly known as: PRADAXA Take 150 mg by mouth 2 (two) times daily.   digoxin 0.125 MG tablet Commonly known as: LANOXIN Take 0.125 mg by mouth daily.   diltiazem 360 MG 24 hr capsule Commonly known as: CARDIZEM CD Take 360 mg by mouth daily.   EpiPen 0.3 mg/0.3 mL Soaj injection Generic drug: EPINEPHrine Inject 0.3 mg into the muscle once.   esomeprazole 40 MG capsule Commonly known as: NEXIUM Take 40 mg by mouth daily at 12  noon.   eszopiclone 3 MG Tabs Generic drug: Eszopiclone Take 3 mg by mouth at bedtime. Take immediately before bedtime   feeding supplement (GLUCERNA SHAKE) Liqd Take 237 mLs by mouth 3 (three) times daily between meals.   Fluticasone-Salmeterol 100-50 MCG/DOSE Aepb Commonly known as: ADVAIR Inhale 1 puff into the lungs 2 (two) times daily. Advair   Glucosamine-Chondroitin-MSM 500-400-300 MG Tabs Take 1 tablet by mouth daily. Not sure of complete formula. Pt recalled Glucosamine-Chondroiton and MSM.   losartan 100 MG tablet Commonly known as: COZAAR Take 100 mg by mouth daily.   montelukast 10 MG tablet Commonly known as: SINGULAIR Take 10 mg by mouth at bedtime.   Nasacort Allergy 24HR 55 MCG/ACT Aero nasal inhaler Generic drug: triamcinolone 2 sprays daily.   predniSONE 10 MG tablet Commonly known as: DELTASONE Take 4 tablets (40 mg total) by mouth daily with breakfast. Take 40 mg daily--taper by 10 mg daily then stop Start taking on: September 10, 2018   PROBIOTIC DAILY PO Take 1 capsule by mouth daily.   SAM-E PO Take 1 tablet by mouth daily.   Ventolin HFA 108 (90 Base) MCG/ACT inhaler Generic drug: albuterol Inhale 1 puff into the lungs every 6 (six) hours as needed for wheezing or shortness of breath.       If you experience worsening of your admission symptoms, develop shortness of breath, life threatening emergency, suicidal or homicidal thoughts you must seek medical attention immediately by calling 911 or calling your MD immediately  if symptoms less severe.  You Must read complete instructions/literature along with all the possible adverse reactions/side effects for all the Medicines you take and that have been prescribed to you. Take any new Medicines after you have completely understood and accept all the possible adverse reactions/side effects.   Please note  You were cared for by a hospitalist during your hospital stay. If you have any questions about  your discharge medications or the care you received while you were in the hospital after you are discharged, you can call the unit and asked to speak with the hospitalist on call if the hospitalist that took care of you is not available. Once you are discharged, your primary care physician will handle any further medical issues. Please note that NO REFILLS for any discharge medications will be authorized once you are discharged, as it is imperative that you return to your primary care physician (or establish a relationship with a primary care physician if you do not have one) for your aftercare needs so that they can reassess your need for medications and monitor your lab values. Today   SUBJECTIVE   I am doing ok today I felt I had stress asthma attack yday  VITAL SIGNS:  Blood pressure (!) 125/48, pulse 64, temperature 97.6 F (36.4 C), temperature source Oral, resp. rate 18, height 5\' 5"  (1.651 m), weight 65.3 kg, SpO2 95 %.  I/O:    Intake/Output Summary (Last 24 hours) at  09/09/2018 1052 Last data filed at 09/09/2018 0646 Gross per 24 hour  Intake 374.58 ml  Output 400 ml  Net -25.42 ml    PHYSICAL EXAMINATION:  GENERAL:  85 y.o.-year-old patient lying in the bed with no acute distress.  EYES: Pupils equal, round, reactive to light and accommodation. No scleral icterus. Extraocular muscles intact.  HEENT: Head atraumatic, normocephalic. Oropharynx and nasopharynx clear.  NECK:  Supple, no jugular venous distention. No thyroid enlargement, no tenderness.  LUNGS: distant breath sounds bilaterally, no wheezing, rales,rhonchi or crepitation. No use of accessory muscles of respiration.  CARDIOVASCULAR: S1, S2 normal. No murmurs, rubs, or gallops.  ABDOMEN: Soft, non-tender, non-distended. Bowel sounds present. No organomegaly or mass.  EXTREMITIES: + pedal edema,no cyanosis, or clubbing.  NEUROLOGIC: Cranial nerves II through XII are intact. Muscle strength 5/5 in all extremities.  Sensation intact. Gait not checked.  PSYCHIATRIC: The patient is alert and oriented x 3.  SKIN: No obvious rash, lesion, or ulcer.   DATA REVIEW:   CBC  Recent Labs  Lab 09/06/18 1543  WBC 12.3*  HGB 11.5*  HCT 34.6*  PLT 257    Chemistries  Recent Labs  Lab 09/06/18 1543  09/08/18 0321  NA 137   < > 140  K 4.0   < > 3.5  CL 102   < > 108  CO2 21*   < > 22  GLUCOSE 124*   < > 111*  BUN 67*   < > 41*  CREATININE 2.70*   < > 1.20*  CALCIUM 9.4   < > 8.3*  AST 34  --   --   ALT 40  --   --   ALKPHOS 116  --   --   BILITOT 1.1  --   --    < > = values in this interval not displayed.    Microbiology Results   Recent Results (from the past 240 hour(s))  SARS Coronavirus 2 (CEPHEID - Performed in [facility] hospital lab), Hosp Order     Status: None   Collection Time: 09/06/18  3:44 PM   Specimen: Nasopharyngeal Swab  Result Value Ref Range Status   SARS Coronavirus 2 NEGATIVE NEGATIVE Final    Comment: (NOTE) If result is NEGATIVE SARS-CoV-2 target nucleic acids are NOT DETECTED. The SARS-CoV-2 RNA is generally detectable in upper and lower  respiratory specimens during the acute phase of infection. The lowest  concentration of SARS-CoV-2 viral copies this assay can detect is 250  copies / mL. A negative result does not preclude SARS-CoV-2 infection  and should not be used as the sole basis for treatment or other  patient management decisions.  A negative result may occur with  improper specimen collection / handling, submission of specimen other  than nasopharyngeal swab, presence of viral mutation(s) within the  areas targeted by this assay, and inadequate number of viral copies  (<250 copies / mL). A negative result must be combined with clinical  observations, patient history, and epidemiological information. If result is POSITIVE SARS-CoV-2 target nucleic acids are DETECTED. The SARS-CoV-2 RNA is generally detectable in upper and lower  respiratory  specimens dur ing the acute phase of infection.  Positive  results are indicative of active infection with SARS-CoV-2.  Clinical  correlation with patient history and other diagnostic information is  necessary to determine patient infection status.  Positive results do  not rule out bacterial infection or co-infection with other viruses. If result is PRESUMPTIVE POSTIVE SARS-CoV-2  nucleic acids MAY BE PRESENT.   A presumptive positive result was obtained on the submitted specimen  and confirmed on repeat testing.  While 2019 novel coronavirus  (SARS-CoV-2) nucleic acids may be present in the submitted sample  additional confirmatory testing may be necessary for epidemiological  and / or clinical management purposes  to differentiate between  SARS-CoV-2 and other Sarbecovirus currently known to infect humans.  If clinically indicated additional testing with an alternate test  methodology [phone]) is advised. The SARS-CoV-2 RNA is generally  detectable in upper and lower respiratory sp ecimens during the acute  phase of infection. The expected result is Negative. Fact Sheet for Patients:  BoilerBrush.com.cy Fact Sheet for Healthcare Providers: https://pope.com/ This test is not yet approved or cleared by the [place] FDA and has been authorized for detection and/or diagnosis of SARS-CoV-2 by FDA under an Emergency Use Authorization (EUA).  This EUA will remain in effect (meaning this test can be used) for the duration of the COVID-19 declaration under Section 564(b)(1) of the Act, 21 U.S.C. section 360bbb-3(b)(1), unless the authorization is terminated or revoked sooner. Performed at [facility], [address] [place], [place] [address]   Blood culture (routine x 2)     Status: None (Preliminary result)   Collection Time: 09/06/18  7:37 PM   Specimen: BLOOD  Result Value Ref Range Status   Specimen Description BLOOD  RIGHT ANTECUBITAL  Final   Special Requests   Final    BOTTLES DRAWN AEROBIC AND ANAEROBIC Blood Culture results may not be optimal due to an excessive volume of blood received in culture bottles   Culture   Final    NO GROWTH 3 DAYS Performed at [facility], [address] [place], [place] [address]    Report Status PENDING  Incomplete  Blood culture (routine x 2)     Status: None (Preliminary result)   Collection Time: 09/06/18  7:37 PM   Specimen: BLOOD  Result Value Ref Range Status   Specimen Description BLOOD BLOOD RIGHT HAND  Final   Special Requests   Final    BOTTLES DRAWN AEROBIC AND ANAEROBIC Blood Culture results may not be optimal due to an excessive volume of blood received in culture bottles   Culture   Final    NO GROWTH 3 DAYS Performed at [facility], [address], [place], [place] [address]    Report Status PENDING  Incomplete  MRSA PCR Screening     Status: None   Collection Time: 09/06/18  9:09 PM   Specimen: Nasopharyngeal  Result Value Ref Range Status   MRSA by PCR NEGATIVE NEGATIVE Final    Comment:        The GeneXpert MRSA Assay (FDA approved for NASAL specimens only), is one component of a comprehensive MRSA colonization surveillance program. It is not intended to diagnose MRSA infection nor to guide or monitor treatment for MRSA infections. Performed at [facility], [address], [place], [place] [address]     RADIOLOGY:  No results found.   CODE STATUS:     Code Status Orders  (From admission, onward)         Start     Ordered   09/06/18 2037  Full code  Continuous     09/06/18 2036        Code Status History    This patient has a current code status but no historical code status.   Advance Care Planning Activity  Advance Directive Documentation     Most Recent Value  Type of Advance Directive  Healthcare Power of Attorney, Living will  Pre-existing out of facility DNR order (yellow  form or pink MOST form)  -  "MOST" Form in Place?  -      TOTAL TIME TAKING CARE OF THIS PATIENT: *40* minutes.    Fritzi Mandes M.D on 09/09/2018 at 10:52 AM  Between 7am to 6pm - Pager - [phone] After 6pm go to [email or web address] - password [email or web address] [facility]  Office  [phone]  CC: Primary care physician; Idelle Crouch, MD

## 2018-09-09 NOTE — TOC Transition Note (Signed)
Transition of Care [facility]) - CM/SW Discharge Note   Patient Details  Name: Tracy Acevedo MRN: [id number] Date of Birth: 06/08/1925  [facility]) CM/SW Contact:  Beverly Sessions, RN Phone Number: 09/09/2018, 3:20 PM   Clinical Narrative:     Patient to discharge today.  Admitted with PNA. Patient lives at home alone at [facility].  Patient states that she does have family that lives locally for support, but she tries not to bother them if she doesn't have too.   [facility]  [facility].  Denies issues with transportation.  States that "friends" at the [place] are available for transportation if needed  Patient to discharge today.  Patient open with [facility] health.  Sarah with [facility] notified of discharge and resumption orders.   Patient request EMS transportation.  Packet completed and placed on chart.   Patient states that she has an old walker, however the brakes don't work.  Patient specifically request rollator.  Referral made to [facility] with [place].  To be delivered to patient's home.  She is aware.   Final next level of care: [place]     Patient Goals and CMS Choice        Discharge Placement                       Discharge Plan and Services   Discharge Planning Services: CM Consult Post Acute Care Choice: Home Health, Resumption of Svcs/PTA Provider          DME Arranged: Walker rolling with seat DME Agency: [employer] Date DME Agency Contacted: 09/09/18 Time DME Agency Contacted: 1500 Representative spoke with at DME Agency: [place]: PT, RN [place] Agency: [place] Date [place]: 09/09/18 Time [place]: 26 Representative spoke with at [place]: [place] Determinants of Health ([place]) Interventions     Readmission Risk Interventions No flowsheet data found.

## 2018-09-09 NOTE — Plan of Care (Signed)
Patient doing well.  Patient does still get short of breath with exertion.  She states this is her baseline.  Patient is planning to be discharged this evening waiting on EMS.

## 2018-09-09 NOTE — Progress Notes (Signed)
Talked to patient's daughter Lamona Curl and gave her an update.  All questions were answered.

## 2018-09-11 LAB — CULTURE, BLOOD (ROUTINE X 2)
Culture: NO GROWTH
Culture: NO GROWTH

## 2018-10-01 ENCOUNTER — Inpatient Hospital Stay
Admission: EM | Admit: 2018-10-01 | Discharge: 2018-10-03 | DRG: 291 | Disposition: A | Discharge status: Home Health | Payer: Medicare Other | Attending: Internal Medicine | Admitting: Internal Medicine

## 2018-10-01 ENCOUNTER — Other Ambulatory Visit: Payer: Self-pay

## 2018-10-01 ENCOUNTER — Emergency Department: Payer: Medicare Other

## 2018-10-01 ENCOUNTER — Encounter: Payer: Self-pay | Admitting: Radiology

## 2018-10-01 DIAGNOSIS — I429 Cardiomyopathy, unspecified: Secondary | ICD-10-CM | POA: Diagnosis present

## 2018-10-01 DIAGNOSIS — Z885 Allergy status to narcotic agent status: Secondary | ICD-10-CM | POA: Diagnosis not present

## 2018-10-01 DIAGNOSIS — I272 Pulmonary hypertension, unspecified: Secondary | ICD-10-CM | POA: Diagnosis present

## 2018-10-01 DIAGNOSIS — Z7951 Long term (current) use of inhaled steroids: Secondary | ICD-10-CM

## 2018-10-01 DIAGNOSIS — Z8701 Personal history of pneumonia (recurrent): Secondary | ICD-10-CM

## 2018-10-01 DIAGNOSIS — H9190 Unspecified hearing loss, unspecified ear: Secondary | ICD-10-CM | POA: Diagnosis present

## 2018-10-01 DIAGNOSIS — R0602 Shortness of breath: Secondary | ICD-10-CM

## 2018-10-01 DIAGNOSIS — Z7901 Long term (current) use of anticoagulants: Secondary | ICD-10-CM

## 2018-10-01 DIAGNOSIS — Z831 Family history of other infectious and parasitic diseases: Secondary | ICD-10-CM

## 2018-10-01 DIAGNOSIS — I447 Left bundle-branch block, unspecified: Secondary | ICD-10-CM | POA: Diagnosis present

## 2018-10-01 DIAGNOSIS — J9601 Acute respiratory failure with hypoxia: Secondary | ICD-10-CM | POA: Diagnosis present

## 2018-10-01 DIAGNOSIS — I482 Chronic atrial fibrillation, unspecified: Secondary | ICD-10-CM | POA: Diagnosis present

## 2018-10-01 DIAGNOSIS — M069 Rheumatoid arthritis, unspecified: Secondary | ICD-10-CM | POA: Diagnosis present

## 2018-10-01 DIAGNOSIS — Z20828 Contact with and (suspected) exposure to other viral communicable diseases: Secondary | ICD-10-CM | POA: Diagnosis present

## 2018-10-01 DIAGNOSIS — K219 Gastro-esophageal reflux disease without esophagitis: Secondary | ICD-10-CM | POA: Diagnosis present

## 2018-10-01 DIAGNOSIS — I509 Heart failure, unspecified: Secondary | ICD-10-CM

## 2018-10-01 DIAGNOSIS — Z882 Allergy status to sulfonamides status: Secondary | ICD-10-CM | POA: Diagnosis not present

## 2018-10-01 DIAGNOSIS — I5023 Acute on chronic systolic (congestive) heart failure: Secondary | ICD-10-CM | POA: Diagnosis present

## 2018-10-01 DIAGNOSIS — Z886 Allergy status to analgesic agent status: Secondary | ICD-10-CM

## 2018-10-01 DIAGNOSIS — Z79899 Other long term (current) drug therapy: Secondary | ICD-10-CM | POA: Diagnosis not present

## 2018-10-01 DIAGNOSIS — M810 Age-related osteoporosis without current pathological fracture: Secondary | ICD-10-CM | POA: Diagnosis present

## 2018-10-01 DIAGNOSIS — I248 Other forms of acute ischemic heart disease: Secondary | ICD-10-CM | POA: Diagnosis present

## 2018-10-01 DIAGNOSIS — Z87891 Personal history of nicotine dependence: Secondary | ICD-10-CM

## 2018-10-01 DIAGNOSIS — Z800 Family history of malignant neoplasm of digestive organs: Secondary | ICD-10-CM

## 2018-10-01 DIAGNOSIS — Z66 Do not resuscitate: Secondary | ICD-10-CM | POA: Diagnosis present

## 2018-10-01 DIAGNOSIS — J449 Chronic obstructive pulmonary disease, unspecified: Secondary | ICD-10-CM | POA: Diagnosis present

## 2018-10-01 DIAGNOSIS — I110 Hypertensive heart disease with heart failure: Secondary | ICD-10-CM | POA: Diagnosis present

## 2018-10-01 DIAGNOSIS — E785 Hyperlipidemia, unspecified: Secondary | ICD-10-CM | POA: Diagnosis present

## 2018-10-01 DIAGNOSIS — E119 Type 2 diabetes mellitus without complications: Secondary | ICD-10-CM | POA: Diagnosis present

## 2018-10-01 DIAGNOSIS — Z880 Allergy status to penicillin: Secondary | ICD-10-CM | POA: Diagnosis not present

## 2018-10-01 LAB — BASIC METABOLIC PANEL
Anion gap: 11 (ref 5–15)
BUN: 14 mg/dL (ref 8–23)
CO2: 20 mmol/L — ABNORMAL LOW (ref 22–32)
Calcium: 9.1 mg/dL (ref 8.9–10.3)
Chloride: 106 mmol/L (ref 98–111)
Creatinine, Ser: 0.64 mg/dL (ref 0.44–1.00)
GFR calc Af Amer: >60 mL/min (ref >60)
GFR calc non Af Amer: >60 mL/min (ref >60)
Glucose, Bld: 107 mg/dL — ABNORMAL HIGH (ref 70–99)
Potassium: 4 mmol/L (ref 3.5–5.1)
Sodium: 137 mmol/L (ref 135–145)

## 2018-10-01 LAB — CBC WITH DIFFERENTIAL/PLATELET
Abs Immature Granulocytes: 0.04 10*3/uL (ref 0.00–0.07)
Basophils Absolute: 0.1 10*3/uL (ref 0.0–0.1)
Basophils Relative: 1 %
Eosinophils Absolute: 0 10*3/uL (ref 0.0–0.5)
Eosinophils Relative: 1 %
HCT: 31.6 % — ABNORMAL LOW (ref 36.0–46.0)
Hemoglobin: 10.4 g/dL — ABNORMAL LOW (ref 12.0–15.0)
Immature Granulocytes: 1 %
Lymphocytes Relative: 19 %
Lymphs Abs: 1.1 10*3/uL (ref 0.7–4.0)
MCH: 28.6 pg (ref 26.0–34.0)
MCHC: 32.9 g/dL (ref 30.0–36.0)
MCV: 86.8 fL (ref 80.0–100.0)
Monocytes Absolute: 0.8 10*3/uL (ref 0.1–1.0)
Monocytes Relative: 13 %
Neutro Abs: 3.9 10*3/uL (ref 1.7–7.7)
Neutrophils Relative %: 65 %
Platelets: 174 10*3/uL (ref 150–400)
RBC: 3.64 MIL/uL — ABNORMAL LOW (ref 3.87–5.11)
RDW: 14.8 % (ref 11.5–15.5)
WBC: 5.9 10*3/uL (ref 4.0–10.5)
nRBC: 0 % (ref 0.0–0.2)

## 2018-10-01 LAB — TROPONIN I (HIGH SENSITIVITY)
Troponin I (High Sensitivity): 47 ng/L — ABNORMAL HIGH (ref <18)
Troponin I (High Sensitivity): 67 ng/L — ABNORMAL HIGH (ref <18)

## 2018-10-01 LAB — DIGOXIN LEVEL: Digoxin Level: 0.8 ng/mL (ref 0.8–2.0)

## 2018-10-01 LAB — SARS CORONAVIRUS 2 BY RT PCR (HOSPITAL ORDER, PERFORMED IN ~~LOC~~ HOSPITAL LAB): SARS Coronavirus 2: NEGATIVE

## 2018-10-01 LAB — BRAIN NATRIURETIC PEPTIDE: B Natriuretic Peptide: 1389 pg/mL — ABNORMAL HIGH (ref 0.0–100.0)

## 2018-10-01 MED ORDER — SODIUM CHLORIDE 0.9% FLUSH
3.0000 mL | Freq: Two times a day (BID) | INTRAVENOUS | Status: DC
  Administered 2018-10-01 – 2018-10-03 (×4): 3 mL via INTRAVENOUS

## 2018-10-01 MED ORDER — ALBUTEROL SULFATE (2.5 MG/3ML) 0.083% IN NEBU: 2.5000 mg | INHALATION_SOLUTION | Freq: Four times a day (QID) | RESPIRATORY_TRACT | Status: DC | PRN

## 2018-10-01 MED ORDER — SODIUM CHLORIDE 0.9% FLUSH: 3.0000 mL | INTRAVENOUS | Status: DC | PRN

## 2018-10-01 MED ORDER — IOHEXOL 350 MG/ML SOLN
75.0000 mL | Freq: Once | INTRAVENOUS | Status: AC | PRN
  Administered 2018-10-01: 75 mL via INTRAVENOUS

## 2018-10-01 MED ORDER — ZOLPIDEM TARTRATE 5 MG PO TABS
5.0000 mg | ORAL_TABLET | Freq: Every evening | ORAL | Status: DC | PRN
  Administered 2018-10-01 – 2018-10-02 (×2): 5 mg via ORAL
  Filled 2018-10-01 (×2): qty 1

## 2018-10-01 MED ORDER — PANTOPRAZOLE SODIUM 40 MG PO TBEC
40.0000 mg | DELAYED_RELEASE_TABLET | Freq: Every day | ORAL | Status: DC
  Administered 2018-10-02 – 2018-10-03 (×2): 40 mg via ORAL
  Filled 2018-10-01 (×2): qty 1

## 2018-10-01 MED ORDER — ENSURE ENLIVE PO LIQD
1.0000 | Freq: Two times a day (BID) | ORAL | Status: DC
  Administered 2018-10-01 – 2018-10-03 (×4): 237 mL via ORAL

## 2018-10-01 MED ORDER — SODIUM CHLORIDE 0.9 % IV SOLN: 250.0000 mL | INTRAVENOUS | Status: DC | PRN

## 2018-10-01 MED ORDER — MONTELUKAST SODIUM 10 MG PO TABS
10.0000 mg | ORAL_TABLET | Freq: Every day | ORAL | Status: DC
  Administered 2018-10-01 – 2018-10-02 (×2): 10 mg via ORAL
  Filled 2018-10-01 (×2): qty 1

## 2018-10-01 MED ORDER — LORATADINE 10 MG PO TABS
10.0000 mg | ORAL_TABLET | Freq: Every day | ORAL | Status: DC
  Administered 2018-10-02 – 2018-10-03 (×2): 10 mg via ORAL
  Filled 2018-10-01 (×2): qty 1

## 2018-10-01 MED ORDER — DABIGATRAN ETEXILATE MESYLATE 150 MG PO CAPS
150.0000 mg | ORAL_CAPSULE | Freq: Two times a day (BID) | ORAL | Status: DC
  Administered 2018-10-01 – 2018-10-03 (×4): 150 mg via ORAL
  Filled 2018-10-01 (×5): qty 1

## 2018-10-01 MED ORDER — FUROSEMIDE 10 MG/ML IJ SOLN
20.0000 mg | Freq: Two times a day (BID) | INTRAMUSCULAR | Status: DC
  Administered 2018-10-01: 20 mg via INTRAVENOUS
  Filled 2018-10-01: qty 2

## 2018-10-01 MED ORDER — ONDANSETRON HCL 4 MG/2ML IJ SOLN: 4.0000 mg | Freq: Four times a day (QID) | INTRAMUSCULAR | Status: DC | PRN

## 2018-10-01 MED ORDER — MOMETASONE FURO-FORMOTEROL FUM 100-5 MCG/ACT IN AERO
2.0000 | INHALATION_SPRAY | Freq: Two times a day (BID) | RESPIRATORY_TRACT | Status: DC
  Administered 2018-10-01 – 2018-10-03 (×4): 2 via RESPIRATORY_TRACT
  Filled 2018-10-01: qty 8.8

## 2018-10-01 MED ORDER — DILTIAZEM HCL ER COATED BEADS 180 MG PO CP24
360.0000 mg | ORAL_CAPSULE | Freq: Every day | ORAL | Status: DC
  Administered 2018-10-02 – 2018-10-03 (×2): 360 mg via ORAL
  Filled 2018-10-01 (×2): qty 2

## 2018-10-01 MED ORDER — DIGOXIN 125 MCG PO TABS
0.1250 mg | ORAL_TABLET | Freq: Every day | ORAL | Status: DC
  Administered 2018-10-02 – 2018-10-03 (×2): 0.125 mg via ORAL
  Filled 2018-10-01 (×3): qty 1

## 2018-10-01 MED ORDER — LOSARTAN POTASSIUM 50 MG PO TABS
100.0000 mg | ORAL_TABLET | Freq: Every day | ORAL | Status: DC
  Administered 2018-10-02 – 2018-10-03 (×2): 100 mg via ORAL
  Filled 2018-10-01 (×2): qty 2

## 2018-10-01 MED ORDER — ACETAMINOPHEN 325 MG PO TABS
650.0000 mg | ORAL_TABLET | ORAL | Status: DC | PRN
  Administered 2018-10-01: 20:00:00 650 mg via ORAL
  Filled 2018-10-01: qty 2

## 2018-10-01 NOTE — ED Provider Notes (Signed)
CT negative for PE, does demonstrate b/l pleural effusions. Given o2 requirement will require admission for further evaluation   Tracy Drafts, MD 10/01/18 [phone]

## 2018-10-01 NOTE — ED Notes (Signed)
ED TO INPATIENT HANDOFF REPORT  ED Nurse Name and Phone #:  Reuel Boom [phone]  S Name/Age/Gender Tracy Acevedo 85 y.o. female Room/Bed: ED11A/ED11A  Code Status   Code Status: Prior  Home/SNF/Other Home Patient oriented to: self, place, time and situation Is this baseline? Yes   Triage Complete: Triage complete  Chief Complaint shortness of breath  Triage Note Patient was noted to have shortness of breath today. Patient was admitted to [facility] last week for pneumonia. Patient was COVID-19 negative during admission.   Allergies Allergies  Allergen Reactions  . Penicillins Hives, Itching and Nausea And Vomiting  . Sulfa Antibiotics Hives, Itching and Nausea And Vomiting  . Aspirin Nausea And Vomiting and Nausea Only  . Tramadol Itching and Hives    hives hives hives  . Tramadol Hcl     Level of Care/Admitting Diagnosis ED Disposition    ED Disposition Condition Comment   Admit  Hospital Area: [facility] [[id number]]  Level of Care: Telemetry [5]  Covid Evaluation: Confirmed COVID Negative  Diagnosis: CHF (congestive heart failure) [facility]) [[id number]]  Admitting Physician: Houston Siren [[id number]]  Attending Physician: Houston Siren [[id number]]  Estimated length of stay: past midnight tomorrow  Certification:: I certify this patient will need inpatient services for at least 2 midnights  PT Class (Do Not Modify): Inpatient [101]  PT Acc Code (Do Not Modify): Private [1]       B Medical/Surgery History Past Medical History:  Diagnosis Date  . AF (atrial fibrillation) (HCC)   . Anemia   . Arthritis, rheumatoid (HCC)   . Asthma   . Bruises easily   . Diabetes (HCC)   . Hearing loss   . History of swelling of feet   . Memory loss   . Muscle cramp   . Muscle pain   . Osteoporosis    Past Surgical History:  Procedure Laterality Date  . COLONOSCOPY    . EYE SURGERY Right   . GALLBLADDER SURGERY    . HIP SURGERY Bilateral   . KNEE  SURGERY Right      A IV Location/Drains/Wounds Patient Lines/Drains/Airways Status   Active Line/Drains/Airways    Name:   Placement date:   Placement time:   Site:   Days:   Peripheral IV 10/01/18 Left Hand   10/01/18    1306    Hand   less than 1   Peripheral IV 10/01/18 Right Antecubital   10/01/18    1519    Antecubital   less than 1   External Urinary Catheter   09/06/18    2049    -   25          Intake/Output Last 24 hours No intake or output data in the 24 hours ending 10/01/18 1734  Labs/Imaging Results for orders placed or performed during the hospital encounter of 10/01/18 (from the past 48 hour(s))  Basic metabolic panel     Status: Abnormal   Collection Time: 10/01/18  1:56 PM  Result Value Ref Range   Sodium 137 135 - 145 mmol/L   Potassium 4.0 3.5 - 5.1 mmol/L   Chloride 106 98 - 111 mmol/L   CO2 20 (L) 22 - 32 mmol/L   Glucose, Bld 107 (H) 70 - 99 mg/dL   BUN 14 8 - 23 mg/dL   Creatinine, Ser 3.47 0.44 - 1.00 mg/dL   Calcium 9.1 8.9 - 42.5 mg/dL   GFR calc non Af Amer >60 >  60 mL/min   GFR calc Af Amer >60 >60 mL/min   Anion gap 11 5 - 15    Comment: Performed at [facility], [address] [place], [place] [address]  CBC with Differential     Status: Abnormal   Collection Time: 10/01/18  1:56 PM  Result Value Ref Range   WBC 5.9 4.0 - 10.5 K/uL   RBC 3.64 (L) 3.87 - 5.11 MIL/uL   Hemoglobin 10.4 (L) 12.0 - 15.0 g/dL   HCT 40.331.6 (L) 47.436.0 - 25.946.0 %   MCV 86.8 80.0 - 100.0 fL   MCH 28.6 26.0 - 34.0 pg   MCHC 32.9 30.0 - 36.0 g/dL   RDW 56.314.8 87.511.5 - 64.315.5 %   Platelets 174 150 - 400 K/uL   nRBC 0.0 0.0 - 0.2 %   Neutrophils Relative % 65 %   Neutro Abs 3.9 1.7 - 7.7 K/uL   Lymphocytes Relative 19 %   Lymphs Abs 1.1 0.7 - 4.0 K/uL   Monocytes Relative 13 %   Monocytes Absolute 0.8 0.1 - 1.0 K/uL   Eosinophils Relative 1 %   Eosinophils Absolute 0.0 0.0 - 0.5 K/uL   Basophils Relative 1 %   Basophils Absolute 0.1 0.0 - 0.1 K/uL   Immature  Granulocytes 1 %   Abs Immature Granulocytes 0.04 0.00 - 0.07 K/uL    Comment: Performed at [facility], [address] [place], [place] [address]  Troponin I (High Sensitivity)     Status: Abnormal   Collection Time: 10/01/18  1:56 PM  Result Value Ref Range   Troponin I (High Sensitivity) 47 (H) <18 ng/L    Comment: (NOTE) Elevated high sensitivity troponin I (hsTnI) values and significant  changes across serial measurements may suggest ACS but many other  chronic and acute conditions are known to elevate hsTnI results.  Refer to the "Links" section for chest pain algorithms and additional  guidance. Performed at [facility], [address] [place], [place] [address]   Brain natriuretic peptide     Status: Abnormal   Collection Time: 10/01/18  2:02 PM  Result Value Ref Range   B Natriuretic Peptide 1,389.0 (H) 0.0 - 100.0 pg/mL    Comment: Performed at [facility], [address] [place], [place] [address]  SARS Coronavirus 2 (CEPHEID - Performed in [facility] hospital lab), Hosp Order     Status: None   Collection Time: 10/01/18  3:47 PM   Specimen: Nasopharyngeal Swab  Result Value Ref Range   SARS Coronavirus 2 NEGATIVE NEGATIVE    Comment: (NOTE) If result is NEGATIVE SARS-CoV-2 target nucleic acids are NOT DETECTED. The SARS-CoV-2 RNA is generally detectable in upper and lower  respiratory specimens during the acute phase of infection. The lowest  concentration of SARS-CoV-2 viral copies this assay can detect is 250  copies / mL. A negative result does not preclude SARS-CoV-2 infection  and should not be used as the sole basis for treatment or other  patient management decisions.  A negative result may occur with  improper specimen collection / handling, submission of specimen other  than nasopharyngeal swab, presence of viral mutation(s) within the  areas targeted by this assay, and inadequate number of viral copies  (<250 copies /  mL). A negative result must be combined with clinical  observations, patient history, and epidemiological information. If result is POSITIVE SARS-CoV-2 target nucleic acids are DETECTED. The SARS-CoV-2 RNA is generally detectable in upper and  lower  respiratory specimens dur ing the acute phase of infection.  Positive  results are indicative of active infection with SARS-CoV-2.  Clinical  correlation with patient history and other diagnostic information is  necessary to determine patient infection status.  Positive results do  not rule out bacterial infection or co-infection with other viruses. If result is PRESUMPTIVE POSTIVE SARS-CoV-2 nucleic acids MAY BE PRESENT.   A presumptive positive result was obtained on the submitted specimen  and confirmed on repeat testing.  While 2019 novel coronavirus  (SARS-CoV-2) nucleic acids may be present in the submitted sample  additional confirmatory testing may be necessary for epidemiological  and / or clinical management purposes  to differentiate between  SARS-CoV-2 and other Sarbecovirus currently known to infect humans.  If clinically indicated additional testing with an alternate test  methodology [phone]([id number]) is advised. The SARS-CoV-2 RNA is generally  detectable in upper and lower respiratory sp ecimens during the acute  phase of infection. The expected result is Negative. Fact Sheet for Patients:  BoilerBrush.com.cyhttps://www.fda.gov/media/136312/download Fact Sheet for Healthcare Providers: https://pope.com/https://www.fda.gov/media/136313/download This test is not yet approved or cleared by the [place] FDA and has been authorized for detection and/or diagnosis of SARS-CoV-2 by FDA under an Emergency Use Authorization (EUA).  This EUA will remain in effect (meaning this test can be used) for the duration of the COVID-19 declaration under Section 564(b)(1) of the Act, 21 U.S.C. section 360bbb-3(b)(1), unless the authorization is terminated or revoked  sooner. Performed at [facility], [address] [place], [place] [address]    Ct Angio Chest Pe W And/or Wo Contrast  Result Date: 10/01/2018 CLINICAL DATA:  Shortness of breath. EXAM: CT ANGIOGRAPHY CHEST WITH CONTRAST TECHNIQUE: Multidetector CT imaging of the chest was performed using the standard protocol during bolus administration of intravenous contrast. Multiplanar CT image reconstructions and MIPs were obtained to evaluate the vascular anatomy. CONTRAST:  75mL OMNIPAQUE IOHEXOL 350 MG/ML SOLN COMPARISON:  Radiograph of same day.  CT scan of May 05, 2007. FINDINGS: Cardiovascular: Satisfactory opacification of the pulmonary arteries to the segmental level. No evidence of pulmonary embolism. Mild cardiomegaly is noted. Right atrial enlargement is noted. Mitral valve calcifications are noted. Atherosclerosis of thoracic aorta is noted without aneurysm formation. No pericardial effusion. Mediastinum/Nodes: Continued presence of probable large left-sided thyroid goiter is noted which causes displacement of trachea to the right. Esophagus is unremarkable. No adenopathy is noted. Lungs/Pleura: No pneumothorax is noted. Mild bilateral pleural effusions are noted with adjacent subsegmental atelectasis. Right upper lobe opacity is noted most consistent with atelectasis scarring or pneumonia. Upper Abdomen: No acute abnormality. Musculoskeletal: No chest wall abnormality. No acute or significant osseous findings. Review of the MIP images confirms the above findings. IMPRESSION: No definite evidence of pulmonary embolus. Mild cardiomegaly is noted with right atrial enlargement. Mild bilateral pleural effusions are noted with adjacent subsegmental atelectasis. Right upper lobe irregular opacity is noted most consistent with atelectasis, scarring or pneumonia. Continued radiographic or CT follow-up is recommended. Large probable left-sided thyroid goiter is noted which causes displacement of  trachea to the right. Aortic Atherosclerosis (ICD10-I70.0). Electronically Signed   By: Lupita RaiderJames  Green Jr M.D.   On: 10/01/2018 16:13   Dg Chest Port 1 View  Result Date: 10/01/2018 CLINICAL DATA:  Shortness of breath today EXAM: PORTABLE CHEST 1 VIEW COMPARISON:  September 06, 2018 FINDINGS: The heart size and mediastinal contours are stable. The heart size is enlarged. The mediastinal contour is stable. Consolidation of the right upper  lobe is identified. There is diffuse increased pulmonary interstitium bilaterally. There is no pleural effusion. The visualized skeletal structures are stable. IMPRESSION: There is persistence of previously noted left upper lobe pneumonia. Cardiomegaly with increased pulmonary interstitium identified bilaterally, consistent with congestive heart failure. Electronically Signed   By: Abelardo Diesel M.D.   On: 10/01/2018 14:53    Pending Labs Unresulted Labs (From admission, onward)    Start     Ordered   10/01/18 1500  Digoxin level  Once,   R     10/01/18 1500   10/01/18 1413  Troponin I (High Sensitivity)  STAT Now then every 2 hours,   STAT    Comments: Add on   10/01/18 1413   Signed and Held  Basic metabolic panel  Daily,   R     Signed and Held          Vitals/Pain Today's Vitals   10/01/18 1615 10/01/18 1630 10/01/18 1645 10/01/18 1700  BP:  (!) 176/71  (!) 183/72  Pulse: 64 68 71 62  Resp: (!) 23 (!) 21 (!) 24 (!) 22  Temp:      TempSrc:      SpO2: 97% 95% 95% 97%  Weight:      Height:      PainSc:        Isolation Precautions No active isolations  Medications Medications  feeding supplement (ENSURE ENLIVE) (ENSURE ENLIVE) liquid 237 mL (has no administration in time range)  iohexol (OMNIPAQUE) 350 MG/ML injection 75 mL (75 mLs Intravenous Contrast Given 10/01/18 1534)    Mobility walks with device Moderate fall risk   Focused Assessments Cardiac Assessment Handoff:    No results found for: CKTOTAL, CKMB, CKMBINDEX, TROPONINI No  results found for: DDIMER Does the Patient currently have chest pain? No      R Recommendations: See Admitting Provider Note  Report given to:   Additional Notes:

## 2018-10-01 NOTE — ED Triage Notes (Signed)
Patient was noted to have shortness of breath today. Patient was admitted to [facility] last week for pneumonia. Patient was COVID-19 negative during admission.

## 2018-10-01 NOTE — ED Provider Notes (Addendum)
[facility] Emergency Department Provider Note  ____________________________________________   I have reviewed the triage vital signs and the nursing notes. Where available I have reviewed prior notes and, if possible and indicated, outside hospital notes.    HISTORY  Chief Complaint Shortness of Breath    HPI Tracy Acevedo is a 85 y.o. female, who lives by herself does have history of atrial fibrillation, anemia, arthritis, chronic shortness of breath for "20 years," COPD, diabetes, who was recently discharged from the hospital with a diagnosis of pneumonia.  States she did not take any medications after going home, has seen her cardiologist and her pulmonologist but not her primary care doctor and has been getting gradually more short of breath over the last week.  She called her pulmonologist, but unfortunately his offices are closed today.  She did then come in.  She states she has been not coughing very hard over her baseline but she has been getting increasingly more short of breath and worried about her breathing.  Is getting the point where she was around how she feels short of breath.  She denies any chest pain.  She has a history of atrial fibrillation.  Dates she is compliant with her medications.  She does not have lower extremity edema.  She is not on home oxygen.    Past Medical History:  Diagnosis Date  . AF (atrial fibrillation) (HCC)   . Anemia   . Arthritis, rheumatoid (HCC)   . Asthma   . Bruises easily   . Diabetes (HCC)   . Hearing loss   . History of swelling of feet   . Memory loss   . Muscle cramp   . Muscle pain   . Osteoporosis     Patient Active Problem List   Diagnosis Date Noted  . HCAP (healthcare-associated pneumonia) 09/06/2018  . Cataract extraction status of eye, left 09/19/2015  . Pseudophakia of right eye 09/19/2015  . Acute ill-defined cerebrovascular disease 07/13/2013  . Absolute anemia 07/13/2013  . Atrial  fibrillation (HCC) 07/13/2013  . Chronic obstructive pulmonary disease (HCC) 07/13/2013  . Dermatophytic onychia 07/13/2013  . Diabetes mellitus (HCC) 07/13/2013  . Enthesopathy of hip 07/13/2013  . Bony exostosis 07/13/2013  . Pure hypercholesterolemia 07/13/2013  . Rheumatoid arthritis (HCC) 07/13/2013  . Synovitis and tenosynovitis 07/13/2013  . Panlobular emphysema (HCC) 07/13/2013  . Rheumatoid arthritis involving multiple joints (HCC) 07/13/2013  . OBESITY, UNSPECIFIED 01/20/2008  . Essential hypertension, benign 01/06/2008  . UNSPECIFIED OSTEOPOROSIS 01/06/2008    Past Surgical History:  Procedure Laterality Date  . COLONOSCOPY    . EYE SURGERY Right   . GALLBLADDER SURGERY    . HIP SURGERY Bilateral   . KNEE SURGERY Right     Prior to Admission medications   Medication Sig Start Date End Date Taking? Authorizing Provider  albuterol (VENTOLIN HFA) 108 (90 BASE) MCG/ACT inhaler Inhale 1 puff into the lungs every 6 (six) hours as needed for wheezing or shortness of breath.    [provider]  BIOTIN PO Take by mouth daily. Dose unknown    [provider]  Calcium Carbonate-Vitamin D (CALTRATE 600+D PO) Take 1 tablet by mouth daily.    [provider]  cetirizine (ZYRTEC) 10 MG tablet Take 10 mg by mouth daily.    [provider]  dabigatran (PRADAXA) 150 MG CAPS capsule Take 150 mg by mouth 2 (two) times daily.    [provider]  digoxin (LANOXIN) 0.125 MG  tablet Take 0.125 mg by mouth daily.    [provider]  diltiazem (CARDIZEM CD) 360 MG 24 hr capsule Take 360 mg by mouth daily.  12/25/17   [provider]  EPINEPHrine (EPIPEN) 0.3 mg/0.3 mL SOAJ injection Inject 0.3 mg into the muscle once.    [provider]  esomeprazole (NEXIUM) 40 MG capsule Take 40 mg by mouth daily at 12 noon.    [provider]  Eszopiclone (ESZOPICLONE) 3 MG TABS Take 3 mg by mouth at bedtime. Take immediately  before bedtime    [provider]  feeding supplement, GLUCERNA SHAKE, (GLUCERNA SHAKE) LIQD Take 237 mLs by mouth 3 (three) times daily between meals. 09/09/18   Enedina FinnerPatel, Sona, MD  Fluticasone-Salmeterol (ADVAIR) 100-50 MCG/DOSE AEPB Inhale 1 puff into the lungs 2 (two) times daily. Advair    [provider]  Glucosamine-Chondroitin-MSM 500-400-300 MG TABS Take 1 tablet by mouth daily. Not sure of complete formula. Pt recalled Glucosamine-Chondroiton and MSM.    [provider]  losartan (COZAAR) 100 MG tablet Take 100 mg by mouth daily.    [provider]  montelukast (SINGULAIR) 10 MG tablet Take 10 mg by mouth at bedtime.    [provider]  NASACORT ALLERGY 24HR 55 MCG/ACT AERO nasal inhaler 2 sprays daily. 03/13/18   [provider]  predniSONE (DELTASONE) 10 MG tablet Take 4 tablets (40 mg total) by mouth daily with breakfast. Take 40 mg daily--taper by 10 mg daily then stop 09/10/18   Enedina FinnerPatel, Sona, MD  Probiotic Product (PROBIOTIC DAILY PO) Take 1 capsule by mouth daily.    [provider]  S-Adenosylmethionine (SAM-E PO) Take 1 tablet by mouth daily.    [provider]    Allergies Penicillins, Sulfa antibiotics, Aspirin, Tramadol, and Tramadol hcl  No family history on file.  Social History Social History   Tobacco Use  . Smoking status: Former Games developermoker  . Smokeless tobacco: Never Used  Substance Use Topics  . Alcohol use: Yes    Comment: social  . Drug use: No    Review of Systems Constitutional: No fever/chills Eyes: No visual changes. ENT: No sore throat. No stiff neck no neck pain Cardiovascular: Denies chest pain. Respiratory: + shortness of breath. Gastrointestinal:   no vomiting.  No diarrhea.  No constipation. Genitourinary: Negative for dysuria. Musculoskeletal: Negative lower extremity swelling Skin: Negative for rash. Neurological: Negative for severe headaches, focal weakness or  numbness.   ____________________________________________   PHYSICAL EXAM:  VITAL SIGNS: ED Triage Vitals  Enc Vitals Group     BP 10/01/18 1344 (!) 188/73     Pulse Rate 10/01/18 1344 66     Resp 10/01/18 1344 (!) 26     Temp 10/01/18 1344 98.6 F (37 C)     Temp Source 10/01/18 1344 Oral     SpO2 10/01/18 1344 91 %     Weight 10/01/18 1347 143 lb 15.4 oz (65.3 kg)     Height 10/01/18 1347 5\' 5"  (1.651 m)     Head Circumference --      Peak Flow --      Pain Score 10/01/18 1346 4     Pain Loc --      Pain Edu? --      Excl. in GC? --     Constitutional: Alert and oriented. Well appearing and in no acute distress. Eyes: Conjunctivae are normal Head: Atraumatic HEENT: No congestion/rhinnorhea. Mucous membranes are moist.  Oropharynx non-erythematous Neck:  Nontender with no meningismus, no masses, no stridor Cardiovascular: Normal rate, regular rhythm. Grossly normal heart sounds excepting a 2 out of 6 systolic murmur good peripheral circulation. Respiratory: Normal respiratory effort.  No retractions.  Diminished in the bases, . Abdominal: Soft and nontender. No distention. No guarding no rebound Back:  There is no focal tenderness or step off.  there is no midline tenderness there are no lesions noted. there is no CVA tenderness Musculoskeletal: No lower extremity tenderness, no upper extremity tenderness. No joint effusions, no DVT signs strong distal pulses no edema Neurologic:  Normal speech and language. No gross focal neurologic deficits are appreciated.  Skin:  Skin is warm, dry and intact. No rash noted. Psychiatric: Mood and affect are normal. Speech and behavior are normal.  ____________________________________________   LABS (all labs ordered are listed, but only abnormal results are displayed)  Labs Reviewed  CBC WITH DIFFERENTIAL/PLATELET - Abnormal; Notable for the following components:      Result Value   RBC 3.64 (*)    Hemoglobin 10.4 (*)    HCT  31.6 (*)    All other components within normal limits  SARS CORONAVIRUS 2 (HOSPITAL ORDER, [facility])  BASIC METABOLIC PANEL  TROPONIN I (HIGH SENSITIVITY)  TROPONIN I (HIGH SENSITIVITY)  BRAIN NATRIURETIC PEPTIDE  DIGOXIN LEVEL    Pertinent labs  results that were available during my care of the patient were reviewed by me and considered in my medical decision making (see chart for details). ____________________________________________  EKG  I personally interpreted any EKGs ordered by me or triage Atrial fibrillation, LBBB, no acute change from prior.  Rate 70 ____________________________________________  RADIOLOGY  Pertinent labs & imaging results that were available during my care of the patient were reviewed by me and considered in my medical decision making (see chart for details). If possible, patient and/or family made aware of any abnormal findings.  No results found. ____________________________________________    PROCEDURES  Procedure(s) performed: None  Procedures  Critical Care performed: None  ____________________________________________   INITIAL IMPRESSION / ASSESSMENT AND PLAN / ED COURSE  Pertinent labs & imaging results that were available during my care of the patient were reviewed by me and considered in my medical decision making (see chart for details).  Patient with worsening dyspnea at home including difficulty getting around.  When she changes position of the bed she does get a little bit winded.  Her sats go down to 90s she is not on home oxygen.  We have placed her on 2 L of oxygen.  Unclear etiology, we are sending a BNP troponin chest x-ray CBC renal function and we will reassess.  Coronavirus is also pending.  Likely will need to be admitted as she does not have oxygen at home and she has hypoxia just when she moves in the bed  ----------------------------------------- 3:02 PM on  10/01/2018 -----------------------------------------  Signed out to dr. Corky Downs.    ____________________________________________   FINAL CLINICAL IMPRESSION(S) / ED DIAGNOSES  Final diagnoses:  SOB (shortness of breath)      This chart was dictated using voice recognition software.  Despite best efforts to proofread,  errors can occur which can change meaning.      Schuyler Amor, MD 10/01/18 1452    Schuyler Amor, MD 10/01/18 [phone]

## 2018-10-01 NOTE — ED Notes (Signed)
Dietary called to send ensure

## 2018-10-01 NOTE — ED Notes (Signed)
Attempted to call report. Floor requested return call in 10 minutes.

## 2018-10-01 NOTE — H&P (Signed)
[facility] - [place] [facility]    PATIENT NAME: Tracy SprangBarbara Acevedo    MR#:  [id number]  DATE OF BIRTH:  September 11, 1925  DATE OF ADMISSION:  10/01/2018  PRIMARY CARE PHYSICIAN: Marguarite ArbourSparks, Jeffrey D, MD   REQUESTING/REFERRING PHYSICIAN: Dr. Jene Everyobert Kinner  CHIEF COMPLAINT:   Chief Complaint  Patient presents with   Shortness of Breath    HISTORY OF PRESENT ILLNESS:  Tracy SprangBarbara Acevedo  is a 85 y.o. female with a known history of atrial fibrillation, diabetes, osteoarthritis, asthma, osteoporosis, recent hospitalization for acute renal failure and suspected pneumonia who presents back to the hospital complaining of shortness of breath.  Patient says her shortness of breath has progressively gotten worse over the past few days to a week.  She says she gets significantly short of breath on minimal exertion and even when talking.  She also noted that her ankles have been a bit more swollen than usual.  She denies any paroxysmal nocturnal dyspnea orthopnea.  She denies any fevers, chills, cough, loss of taste or any sick contacts.  Patient's COVID-19 test is negative.  Patient presented to the hospital was noted to be hypoxic with O2 sats in the mid 80s and improved with 2 L nasal cannula.  Patient's BNP was elevated and her chest x-ray findings are consistent with mild CHF.  Patient CT chest was negative for pulmonary embolism.  Hospitalist services were contacted for admission for congestive heart failure.  PAST MEDICAL HISTORY:   Past Medical History:  Diagnosis Date   AF (atrial fibrillation) (HCC)    Anemia    Arthritis, rheumatoid (HCC)    Asthma    Bruises easily    Diabetes (HCC)    Hearing loss    History of swelling of feet    Memory loss    Muscle cramp    Muscle pain    Osteoporosis     PAST SURGICAL HISTORY:   Past Surgical History:  Procedure Laterality Date   COLONOSCOPY     EYE SURGERY Right    GALLBLADDER SURGERY     HIP SURGERY Bilateral     KNEE SURGERY Right     SOCIAL HISTORY:   Social History   Tobacco Use   Smoking status: Former Smoker    Packs/day: 0.25    Years: 15.00    Pack years: 3.75    Types: Cigarettes   Smokeless tobacco: Never Used  Substance Use Topics   Alcohol use: Yes    Comment: social    FAMILY HISTORY:   Family History  Problem Relation Age of Onset   Pancreatic cancer Mother    Tuberculosis Father     DRUG ALLERGIES:   Allergies  Allergen Reactions   Penicillins Hives, Itching and Nausea And Vomiting   Sulfa Antibiotics Hives, Itching and Nausea And Vomiting   Aspirin Nausea And Vomiting and Nausea Only   Tramadol Itching and Hives    hives hives hives   Tramadol Hcl     REVIEW OF SYSTEMS:   Review of Systems  Constitutional: Negative for fever and weight loss.  HENT: Negative for congestion, nosebleeds and tinnitus.   Eyes: Negative for blurred vision, double vision and redness.  Respiratory: Positive for shortness of breath. Negative for cough and hemoptysis.   Cardiovascular: Positive for leg swelling. Negative for chest pain, orthopnea and PND.  Gastrointestinal: Negative for abdominal pain, diarrhea, melena, nausea and vomiting.  Genitourinary: Negative for dysuria, hematuria and urgency.  Musculoskeletal: Negative for falls and  joint pain.  Neurological: Negative for dizziness, tingling, sensory change, focal weakness, seizures, weakness and headaches.  Endo/Heme/Allergies: Negative for polydipsia. Does not bruise/bleed easily.  Psychiatric/Behavioral: Negative for depression and memory loss. The patient is not nervous/anxious.     MEDICATIONS AT HOME:   Prior to Admission medications   Medication Sig Start Date End Date Taking? Authorizing Provider  albuterol (VENTOLIN HFA) 108 (90 BASE) MCG/ACT inhaler Inhale 1 puff into the lungs every 6 (six) hours as needed for wheezing or shortness of breath.    [provider]  BIOTIN PO Take by  mouth daily. Dose unknown    [provider]  Calcium Carbonate-Vitamin D (CALTRATE 600+D PO) Take 1 tablet by mouth daily.    [provider]  cetirizine (ZYRTEC) 10 MG tablet Take 10 mg by mouth daily.    [provider]  dabigatran (PRADAXA) 150 MG CAPS capsule Take 150 mg by mouth 2 (two) times daily.    [provider]  digoxin (LANOXIN) 0.125 MG tablet Take 0.125 mg by mouth daily.    [provider]  diltiazem (CARDIZEM CD) 360 MG 24 hr capsule Take 360 mg by mouth daily.  12/25/17   [provider]  EPINEPHrine (EPIPEN) 0.3 mg/0.3 mL SOAJ injection Inject 0.3 mg into the muscle once.    [provider]  esomeprazole (NEXIUM) 40 MG capsule Take 40 mg by mouth daily at 12 noon.    [provider]  Eszopiclone (ESZOPICLONE) 3 MG TABS Take 3 mg by mouth at bedtime. Take immediately before bedtime    [provider]  feeding supplement, GLUCERNA SHAKE, (GLUCERNA SHAKE) LIQD Take 237 mLs by mouth 3 (three) times daily between meals. 09/09/18   Enedina Finner, MD  Fluticasone-Salmeterol (ADVAIR) 100-50 MCG/DOSE AEPB Inhale 1 puff into the lungs 2 (two) times daily. Advair    [provider]  Glucosamine-Chondroitin-MSM 500-400-300 MG TABS Take 1 tablet by mouth daily. Not sure of complete formula. Pt recalled Glucosamine-Chondroiton and MSM.    [provider]  losartan (COZAAR) 100 MG tablet Take 100 mg by mouth daily.    [provider]  montelukast (SINGULAIR) 10 MG tablet Take 10 mg by mouth at bedtime.    [provider]  NASACORT ALLERGY 24HR 55 MCG/ACT AERO nasal inhaler 2 sprays daily. 03/13/18   [provider]  predniSONE (DELTASONE) 10 MG tablet Take 4 tablets (40 mg total) by mouth daily with breakfast. Take 40 mg daily--taper by 10 mg daily then stop Patient not taking: Reported on 10/01/2018 09/10/18   Enedina Finner, MD  Probiotic Product (PROBIOTIC DAILY PO) Take 1  capsule by mouth daily.    [provider]  S-Adenosylmethionine (SAM-E PO) Take 1 tablet by mouth daily.    [provider]      VITAL SIGNS:  Blood pressure (!) 168/62, pulse (!) 55, temperature 98.6 F (37 C), temperature source Oral, resp. rate (!) 21, height 5\' 5"  (1.651 m), weight 65.3 kg, SpO2 96 %.  PHYSICAL EXAMINATION:  Physical Exam  GENERAL:  85 y.o.-year-old patient lying in the bed in mild resp. Distress.  EYES: Pupils equal, round, reactive to light and accommodation. No scleral icterus. Extraocular muscles intact.  HEENT: Head atraumatic, normocephalic. Oropharynx and nasopharynx clear. No oropharyngeal erythema, moist oral mucosa  NECK:  Supple, no jugular venous distention. No thyroid enlargement, no tenderness.  LUNGS: Normal breath sounds bilaterally, no wheezing, bibasilar rales, No rhonchi. No use of accessory muscles of respiration.  CARDIOVASCULAR: S1, S2 RRR. No murmurs, rubs, gallops, clicks.  ABDOMEN: Soft, nontender, nondistended. Bowel sounds present. No organomegaly or mass.  EXTREMITIES: + 1 Ankle edema b/l, No cyanosis, or clubbing. + 2 pedal & radial pulses b/l.   NEUROLOGIC: Cranial nerves II through XII are intact. No focal Motor or sensory deficits appreciated b/l PSYCHIATRIC: The patient is alert and oriented x 3.  SKIN: No obvious rash, lesion, or ulcer.   LABORATORY PANEL:   CBC Recent Labs  Lab 10/01/18 1356  WBC 5.9  HGB 10.4*  HCT 31.6*  PLT 174   ------------------------------------------------------------------------------------------------------------------  Chemistries  Recent Labs  Lab 10/01/18 1356  NA 137  K 4.0  CL 106  CO2 20*  GLUCOSE 107*  BUN 14  CREATININE 0.64  CALCIUM 9.1   ------------------------------------------------------------------------------------------------------------------  Cardiac Enzymes No results for input(s): TROPONINI in the last 168  hours. ------------------------------------------------------------------------------------------------------------------  RADIOLOGY:  Ct Angio Chest Pe W And/or Wo Contrast  Result Date: 10/01/2018 CLINICAL DATA:  Shortness of breath. EXAM: CT ANGIOGRAPHY CHEST WITH CONTRAST TECHNIQUE: Multidetector CT imaging of the chest was performed using the standard protocol during bolus administration of intravenous contrast. Multiplanar CT image reconstructions and MIPs were obtained to evaluate the vascular anatomy. CONTRAST:  29mL OMNIPAQUE IOHEXOL 350 MG/ML SOLN COMPARISON:  Radiograph of same day.  CT scan of May 05, 2007. FINDINGS: Cardiovascular: Satisfactory opacification of the pulmonary arteries to the segmental level. No evidence of pulmonary embolism. Mild cardiomegaly is noted. Right atrial enlargement is noted. Mitral valve calcifications are noted. Atherosclerosis of thoracic aorta is noted without aneurysm formation. No pericardial effusion. Mediastinum/Nodes: Continued presence of probable large left-sided thyroid goiter is noted which causes displacement of trachea to the right. Esophagus is unremarkable. No adenopathy is noted. Lungs/Pleura: No pneumothorax is noted. Mild bilateral pleural effusions are noted with adjacent subsegmental atelectasis. Right upper lobe opacity is noted most consistent with atelectasis scarring or pneumonia. Upper Abdomen: No acute abnormality. Musculoskeletal: No chest wall abnormality. No acute or significant osseous findings. Review of the MIP images confirms the above findings. IMPRESSION: No definite evidence of pulmonary embolus. Mild cardiomegaly is noted with right atrial enlargement. Mild bilateral pleural effusions are noted with adjacent subsegmental atelectasis. Right upper lobe irregular opacity is noted most consistent with atelectasis, scarring or pneumonia. Continued radiographic or CT follow-up is recommended. Large probable left-sided thyroid goiter  is noted which causes displacement of trachea to the right. Aortic Atherosclerosis (ICD10-I70.0). Electronically Signed   By: Marijo Conception M.D.   On: 10/01/2018 16:13   Dg Chest Port 1 View  Result Date: 10/01/2018 CLINICAL DATA:  Shortness of breath today EXAM: PORTABLE CHEST 1 VIEW COMPARISON:  September 06, 2018 FINDINGS: The heart size and mediastinal contours are stable. The heart size is enlarged. The mediastinal contour is stable. Consolidation of the right upper lobe is identified. There is diffuse increased pulmonary interstitium bilaterally. There is no pleural effusion. The visualized skeletal structures are stable. IMPRESSION: There is persistence of previously noted left upper lobe pneumonia. Cardiomegaly with increased pulmonary interstitium identified bilaterally, consistent with congestive heart failure. Electronically Signed   By: Abelardo Diesel M.D.   On: 10/01/2018 14:53     IMPRESSION AND PLAN:   85 y.o. female with a known history of atrial fibrillation, diabetes, osteoarthritis, asthma, osteoporosis, recent hospitalization for acute renal failure and suspected pneumonia who presents back to the hospital complaining of shortness of breath.  1.  Acute respiratory failure with hypoxia- suspected to be secondary  to mild CHF.  Patient was recently hospitalized secondary to COPD/pneumonia and discharged on some oral antibiotics and now comes back with a new O2 requirement and shortness of breath. - We will diurese the patient with IV Lasix, wean oxygen as tolerated.  2.  CHF- await echocardiogram results to figure out whether it is systolic or diastolic. - We will diurese the patient with IV Lasix, follow I's and O's and daily weights. -Continue losartan, Cardizem. -We will get cardiology consult  3.  Elevated troponin-suspect to be supply demand ischemia secondary to CHF. -Observe on telemetry, cycle patient's markers.  Await echocardiogram results. -Await further cardiology  input.  4.  History of atrial fibrillation-rate controlled. -Continue Cardizem, digoxin. -  continue Pradaxa.  5.  COPD-no acute exacerbation.  Continue Dulera, albuterol inhaler as needed.  6.  GERD-continue Protonix.    All the records are reviewed and case discussed with ED provider. Management plans discussed with the patient, family and they are in agreement.  CODE STATUS: DNR  TOTAL TIME TAKING CARE OF THIS PATIENT: 40 minutes.    Houston Siren M.D on 10/01/2018 at 5:18 PM  Between 7am to 6pm - Pager - [phone]  After 6pm go to [email or web address] - password [email or web address] [facility]  [facility]  Office  [phone]  CC: Primary care physician; Marguarite Arbour, MD

## 2018-10-02 ENCOUNTER — Inpatient Hospital Stay
Admit: 2018-10-02 | Discharge: 2018-10-02 | Disposition: A | Discharge status: Home / Self Care | Payer: Medicare Other | Attending: Specialist | Admitting: Specialist

## 2018-10-02 LAB — BASIC METABOLIC PANEL
Anion gap: 9 (ref 5–15)
BUN: 12 mg/dL (ref 8–23)
CO2: 24 mmol/L (ref 22–32)
Calcium: 8.8 mg/dL — ABNORMAL LOW (ref 8.9–10.3)
Chloride: 106 mmol/L (ref 98–111)
Creatinine, Ser: 0.69 mg/dL (ref 0.44–1.00)
GFR calc Af Amer: >60 mL/min (ref >60)
GFR calc non Af Amer: >60 mL/min (ref >60)
Glucose, Bld: 99 mg/dL (ref 70–99)
Potassium: 3.7 mmol/L (ref 3.5–5.1)
Sodium: 139 mmol/L (ref 135–145)

## 2018-10-02 LAB — ECHOCARDIOGRAM COMPLETE
Height: 65 in
Weight: 2321 oz

## 2018-10-02 MED ORDER — FUROSEMIDE 10 MG/ML IJ SOLN
20.0000 mg | Freq: Two times a day (BID) | INTRAMUSCULAR | Status: DC
  Administered 2018-10-02 – 2018-10-03 (×3): 20 mg via INTRAVENOUS
  Filled 2018-10-02 (×3): qty 2

## 2018-10-02 MED ORDER — POTASSIUM CHLORIDE CRYS ER 20 MEQ PO TBCR
40.0000 meq | EXTENDED_RELEASE_TABLET | Freq: Once | ORAL | Status: AC
  Administered 2018-10-02: 40 meq via ORAL
  Filled 2018-10-02: qty 2

## 2018-10-02 NOTE — Progress Notes (Signed)
Advance care planning  Purpose of Encounter CHF  Parties in Attendance Patient, phone call from bedside with daughter Tracy Acevedo  Patient is alert and oriented.  Able to make medical decisions. Her daughter Tracy Acevedo makes decisions if she is unable to.  Discussed in detail regarding CHF.  Treatment plan , prognosis discussed.  All questions answered.  CODE STATUS is DNR/DNI  Orders entered and CODE STATUS changed  DNR/DNI  Time spent - 17 minutes

## 2018-10-02 NOTE — Consult Note (Signed)
Cardiology Consultation Note    Patient ID: Tracy Acevedo, MRN: [id number], DOB/AGE: 1925/07/21 85 y.o. Admit date: 10/01/2018   Date of Consult: 10/02/2018 Primary Physician: Marguarite ArbourSparks, Jeffrey D, MD Primary Cardiologist: Dr. Lady GaryFath  Chief Complaint: sob Reason for Consultation: Dante Gangchf Requesting MD: Dr. Milagros LollSrikar Sudini  HPI: Tracy SersBarbara L Maltos is a 85 y.o. female with history of chronic atrial fibrillation treated with rate control with digoxin, diltiazem and anticoagulated with dabigatran, COPD, hyperlipidemia, hypertension,HFrEF with ejection fraction of 30% by echocardiogram in 2017, recent admission for community-acquired pneumonia, presented to emergency room with increasing shortness of breath.  Recently saw both her pulmonologist and myself in the office.  Chest x-ray done recently revealed persistent airspace disease with persistent left upper lobe pneumonia and no significant pleural effusions.  There was mild increased pulmonary interstitium consistent with possible early volume overload.  Chest CT revealed no pulmonary embolus with mild bilateral pleural effusions with evidence of increased right-sided pressures.  Renal function was normal.  High-sensitivity troponin mildly elevated .  Digoxin level is in low therapeutic range.  White count was normal.  Mild reduced hemoglobin and hematocrit.  Patient denied chest pain but complained of shortness of breath.  EKG showed atrial fibrillation with controlled ventricular response with left bundle branch block.  She was given intravenous diuretics and is diuresed approximately 700 cc since admission.  Past Medical History:  Diagnosis Date  . AF (atrial fibrillation) (HCC)   . Anemia   . Arthritis, rheumatoid (HCC)   . Asthma   . Bruises easily   . Diabetes (HCC)   . Hearing loss   . History of swelling of feet   . Memory loss   . Muscle cramp   . Muscle pain   . Osteoporosis       Surgical History:  Past Surgical History:  Procedure  Laterality Date  . COLONOSCOPY    . EYE SURGERY Right   . GALLBLADDER SURGERY    . HIP SURGERY Bilateral   . KNEE SURGERY Right      Home Meds: Prior to Admission medications   Medication Sig Start Date End Date Taking? Authorizing Provider  albuterol (VENTOLIN HFA) 108 (90 BASE) MCG/ACT inhaler Inhale 2 puffs into the lungs every 6 (six) hours as needed for wheezing or shortness of breath.    Yes [provider]  Biotin 10 MG TABS Take 10 mg by mouth daily. Dose unknown    Yes [provider]  Calcium Carbonate-Vitamin D (CALTRATE 600+D PO) Take 1 tablet by mouth daily.   Yes [provider]  cetirizine (ZYRTEC) 10 MG tablet Take 10 mg by mouth daily.   Yes [provider]  dabigatran (PRADAXA) 150 MG CAPS capsule Take 150 mg by mouth 2 (two) times daily.   Yes [provider]  digoxin (LANOXIN) 0.125 MG tablet Take 0.125 mg by mouth daily.   Yes [provider]  diltiazem (CARDIZEM CD) 360 MG 24 hr capsule Take 360 mg by mouth daily.  12/25/17  Yes [provider]  EPINEPHrine (EPIPEN) 0.3 mg/0.3 mL SOAJ injection Inject 0.3 mg into the muscle once.   Yes [provider]  esomeprazole (NEXIUM) 40 MG capsule Take 40 mg by mouth daily at 12 noon.   Yes [provider]  Eszopiclone (ESZOPICLONE) 3 MG TABS Take 3 mg by mouth at bedtime as needed (sleep). Take immediately before bedtime    Yes [provider]  Fluticasone-Salmeterol (ADVAIR) 100-50 MCG/DOSE AEPB Inhale  1 puff into the lungs 2 (two) times daily.    Yes [provider]  Glucosamine-Chondroitin-MSM 500-400-300 MG TABS Take 1 tablet by mouth daily.    Yes [provider]  losartan (COZAAR) 100 MG tablet Take 100 mg by mouth daily.   Yes [provider]  montelukast (SINGULAIR) 10 MG tablet Take 10 mg by mouth at bedtime.   Yes [provider]  NASACORT ALLERGY 24HR 55 MCG/ACT AERO nasal inhaler 2 sprays  daily. 03/13/18  Yes [provider]  Probiotic Product (PROBIOTIC DAILY PO) Take 1 capsule by mouth daily.   Yes [provider]  S-Adenosylmethionine (SAM-E PO) Take 1 tablet by mouth daily.   Yes [provider]  feeding supplement, GLUCERNA SHAKE, (GLUCERNA SHAKE) LIQD Take 237 mLs by mouth 3 (three) times daily between meals. Patient not taking: Reported on 10/01/2018 09/09/18   Enedina FinnerPatel, Sona, MD  predniSONE (DELTASONE) 10 MG tablet Take 4 tablets (40 mg total) by mouth daily with breakfast. Take 40 mg daily--taper by 10 mg daily then stop Patient not taking: Reported on 10/01/2018 09/10/18   Enedina FinnerPatel, Sona, MD    Inpatient Medications:  . dabigatran  150 mg Oral BID  . digoxin  0.125 mg Oral Daily  . diltiazem  360 mg Oral Daily  . feeding supplement (ENSURE ENLIVE)  1 Bottle Oral BID BM  . furosemide  20 mg Intravenous Q12H  . loratadine  10 mg Oral Daily  . losartan  100 mg Oral Daily  . mometasone-formoterol  2 puff Inhalation BID  . montelukast  10 mg Oral QHS  . pantoprazole  40 mg Oral Daily  . sodium chloride flush  3 mL Intravenous Q12H   . sodium chloride      Allergies:  Allergies  Allergen Reactions  . Penicillins Hives, Itching and Nausea And Vomiting  . Sulfa Antibiotics Hives, Itching and Nausea And Vomiting  . Aspirin Nausea And Vomiting and Nausea Only  . Tramadol Itching and Hives    hives hives hives  . Tramadol Hcl     Social History   Socioeconomic History  . Marital status: Divorced    Spouse name: Not on file  . Number of children: Not on file  . Years of education: Not on file  . Highest education level: Not on file  Occupational History  . Not on file  Social Needs  . Financial resource strain: Not on file  . Food insecurity    Worry: Not on file    Inability: Not on file  . Transportation needs    Medical: Not on file    Non-medical: Not on file  Tobacco Use  . Smoking status: Former Smoker    Packs/day: 0.25     Years: 15.00    Pack years: 3.75    Types: Cigarettes  . Smokeless tobacco: Never Used  Substance and Sexual Activity  . Alcohol use: Yes    Comment: social  . Drug use: No  . Sexual activity: Not on file  Lifestyle  . Physical activity    Days per week: Patient refused    Minutes per session: Patient refused  . Stress: Not on file  Relationships  . Social connections    Talks on phone: More than three times a week    Gets together: Once a week    Attends religious service: Not on file    Active member of club or organization: Not on file    Attends meetings of  clubs or organizations: Not on file    Relationship status: Not on file  . Intimate partner violence    Fear of current or ex partner: Not on file    Emotionally abused: Not on file    Physically abused: Not on file    Forced sexual activity: Not on file  Other Topics Concern  . Not on file  Social History Narrative  . Not on file     Family History  Problem Relation Age of Onset  . Pancreatic cancer Mother   . Tuberculosis Father      Review of Systems: A 12-system review of systems was performed and is negative except as noted in the HPI.  Labs: No results for input(s): CKTOTAL, CKMB, TROPONINI in the last 72 hours. Lab Results  Component Value Date   WBC 5.9 10/01/2018   HGB 10.4 (L) 10/01/2018   HCT 31.6 (L) 10/01/2018   MCV 86.8 10/01/2018   PLT 174 10/01/2018    Recent Labs  Lab 10/02/18 0535  NA 139  K 3.7  CL 106  CO2 24  BUN 12  CREATININE 0.69  CALCIUM 8.8*  GLUCOSE 99   No results found for: CHOL, HDL, LDLCALC, TRIG No results found for: DDIMER  Radiology/Studies:  Ct Abdomen Pelvis Wo Contrast  Result Date: 09/06/2018 CLINICAL DATA:  Renal failure, shortness of breath. EXAM: CT ABDOMEN AND PELVIS WITHOUT CONTRAST TECHNIQUE: Multidetector CT imaging of the abdomen and pelvis was performed following the standard protocol without IV contrast. COMPARISON:  CT scan of September 22, 2007.  FINDINGS: Lower chest: No acute abnormality. Hepatobiliary: No focal liver abnormality is seen. No gallstones, gallbladder wall thickening, or biliary dilatation. Pancreas: Unremarkable. No pancreatic ductal dilatation or surrounding inflammatory changes. Spleen: Normal in size without focal abnormality. Adrenals/Urinary Tract: Stable left adrenal adenoma is noted. Right adrenal gland appears normal. No hydronephrosis or renal obstruction is noted. No renal or ureteral calculi are noted. Urinary bladder is unremarkable. Stomach/Bowel: Stomach is within normal limits. Appendix appears normal. No evidence of bowel wall thickening, distention, or inflammatory changes. Diverticulosis of sigmoid colon is noted without inflammation. Vascular/Lymphatic: Aortic atherosclerosis. No enlarged abdominal or pelvic lymph nodes. Reproductive: Uterus and bilateral adnexa are unremarkable. Other: No abdominal wall hernia or abnormality. No abdominopelvic ascites. Musculoskeletal: Status post bilateral total hip arthroplasties. No acute osseous abnormality is noted. IMPRESSION: No acute abnormality seen in the abdomen or pelvis. Sigmoid diverticulosis without inflammation. Stable left adrenal adenoma. Aortic Atherosclerosis (ICD10-I70.0). Electronically Signed   By: Lupita Raider M.D.   On: 09/06/2018 18:56   Ct Angio Chest Pe W And/or Wo Contrast  Result Date: 10/01/2018 CLINICAL DATA:  Shortness of breath. EXAM: CT ANGIOGRAPHY CHEST WITH CONTRAST TECHNIQUE: Multidetector CT imaging of the chest was performed using the standard protocol during bolus administration of intravenous contrast. Multiplanar CT image reconstructions and MIPs were obtained to evaluate the vascular anatomy. CONTRAST:  67mL OMNIPAQUE IOHEXOL 350 MG/ML SOLN COMPARISON:  Radiograph of same day.  CT scan of May 05, 2007. FINDINGS: Cardiovascular: Satisfactory opacification of the pulmonary arteries to the segmental level. No evidence of pulmonary  embolism. Mild cardiomegaly is noted. Right atrial enlargement is noted. Mitral valve calcifications are noted. Atherosclerosis of thoracic aorta is noted without aneurysm formation. No pericardial effusion. Mediastinum/Nodes: Continued presence of probable large left-sided thyroid goiter is noted which causes displacement of trachea to the right. Esophagus is unremarkable. No adenopathy is noted. Lungs/Pleura: No pneumothorax is noted. Mild bilateral pleural effusions are  noted with adjacent subsegmental atelectasis. Right upper lobe opacity is noted most consistent with atelectasis scarring or pneumonia. Upper Abdomen: No acute abnormality. Musculoskeletal: No chest wall abnormality. No acute or significant osseous findings. Review of the MIP images confirms the above findings. IMPRESSION: No definite evidence of pulmonary embolus. Mild cardiomegaly is noted with right atrial enlargement. Mild bilateral pleural effusions are noted with adjacent subsegmental atelectasis. Right upper lobe irregular opacity is noted most consistent with atelectasis, scarring or pneumonia. Continued radiographic or CT follow-up is recommended. Large probable left-sided thyroid goiter is noted which causes displacement of trachea to the right. Aortic Atherosclerosis (ICD10-I70.0). Electronically Signed   By: Marijo Conception M.D.   On: 10/01/2018 16:13   US Renal  Result Date: 09/07/2018 CLINICAL DATA:  Acute renal failure EXAM: RENAL / URINARY TRACT ULTRASOUND COMPLETE COMPARISON:  CT abdomen pelvis 09/06/2018 FINDINGS: Right Kidney: Renal measurements: 11.1 x 3.9 x 4.2 cm = volume: 96 mL . Echogenicity within normal limits. No mass or hydronephrosis visualized. Left Kidney: Renal measurements: 10.8 x 5.2 x 4.8 cm = volume: 141 mL. Echogenicity within normal limits. No mass or hydronephrosis visualized. Bladder: Appears normal for degree of bladder distention. IMPRESSION: Negative renal ultrasound. Electronically Signed   By:  Franchot Gallo M.D.   On: 09/07/2018 10:34   Dg Chest Port 1 View  Result Date: 10/01/2018 CLINICAL DATA:  Shortness of breath today EXAM: PORTABLE CHEST 1 VIEW COMPARISON:  September 06, 2018 FINDINGS: The heart size and mediastinal contours are stable. The heart size is enlarged. The mediastinal contour is stable. Consolidation of the right upper lobe is identified. There is diffuse increased pulmonary interstitium bilaterally. There is no pleural effusion. The visualized skeletal structures are stable. IMPRESSION: There is persistence of previously noted left upper lobe pneumonia. Cardiomegaly with increased pulmonary interstitium identified bilaterally, consistent with congestive heart failure. Electronically Signed   By: Abelardo Diesel M.D.   On: 10/01/2018 14:53   Dg Chest Portable 1 View  Result Date: 09/06/2018 CLINICAL DATA:  Renal failure.  Shortness of breath. EXAM: PORTABLE CHEST 1 VIEW COMPARISON:  September 22, 2007 FINDINGS: Focal infiltrate is seen in the right upper lobe. Cardiomegaly. The hila and mediastinum are stable. No other interval changes. IMPRESSION: Focal infiltrate in the right upper lobe worrisome for pneumonia or aspiration. Recommend short-term follow-up to ensure resolution. Electronically Signed   By: Dorise Bullion III M.D   On: 09/06/2018 16:12    Wt Readings from Last 3 Encounters:  10/01/18 (P) 65.8 kg  09/06/18 65.3 kg  06/29/17 69.4 kg    EKG: Atrial fibrillation with controlled ventricular response, left bundle branch block  Physical Exam:  Blood pressure (!) 152/73, pulse 65, temperature 98.6 F (37 C), temperature source Oral, resp. rate 18, height 5\' 5"  (1.651 m), weight (P) 65.8 kg, SpO2 95 %. Body mass index is 24.14 kg/m (pended). General: Well developed, well nourished, in no acute distress. Head: Normocephalic, atraumatic, sclera non-icteric, no xanthomas, nares are without discharge.  Neck: Negative for carotid bruits. JVD not elevated. Lungs: Clear  bilaterally to auscultation without wheezes, rales, or rhonchi. Breathing is unlabored. Heart: Irregular irregular rhythm with a 2/6 systolic murmur radiating to the apex Abdomen: Soft, non-tender, non-distended with normoactive bowel sounds. No hepatomegaly. No rebound/guarding. No obvious abdominal masses. Msk:  Strength and tone appear normal for age. Extremities: No clubbing or cyanosis.  1+ lower extremity edema distal pedal pulses are 2+ and equal bilaterally. Neuro: Alert and oriented X 3.  No facial asymmetry. No focal deficit. Moves all extremities spontaneously. Psych:  Responds to questions appropriately with a normal affect.     Assessment and Plan  85 year old female with history of heart failure with reduced ejection fraction with a known ejection fraction of 30%, history of COPD, history of pulmonary hypertension, history of atrial fibrillation treated with rate control and anticoagulation, history of recent admission with pneumonia who presented with increasing shortness of breath.  Known to have mild volume overload on chest x-ray with persistent left upper lobe infiltrate.  Has improved clinically with diuresis of 700 cc.  Has ruled out for myocardial infarction.  Elevated troponin drawn as part of admission protocol is consistent with demand ischemia.  We will continue with rate control and chronic anticoagulation.  With regard to volume overload, appears to be mild but likely the tipping point for her admission.  We will continue to carefully diurese following renal function and hemodynamics.  Likely need 24 more hours of diuresis and medication optimization with consideration for discharge in the morning if stable.  Signed, Dalia Heading MD 10/02/2018, 8:21 AM Pager: [phone]

## 2018-10-02 NOTE — Progress Notes (Signed)
Able to wean pt off oxygen today. Pt is sating 94-95% on room air. Pt had no c/o dyspnea.

## 2018-10-02 NOTE — Progress Notes (Signed)
*  PRELIMINARY RESULTS* Echocardiogram 2D Echocardiogram has been performed.  Tracy Acevedo 10/02/2018, 2:57 PM

## 2018-10-02 NOTE — Progress Notes (Signed)
[facility] [place] NAME: Tracy Acevedo    MR#:  [id number]  DATE OF BIRTH:  [birth year]  SUBJECTIVE:  CHIEF COMPLAINT:   Chief Complaint  Patient presents with  . Shortness of Breath   Continues to be on oxygen.  Has shortness of breath and lower extremity edema. Afebrile.  REVIEW OF SYSTEMS:    Review of Systems  Constitutional: Positive for malaise/fatigue. Negative for chills and fever.  HENT: Negative for sore throat.   Eyes: Negative for blurred vision, double vision and pain.  Respiratory: Positive for shortness of breath. Negative for cough, hemoptysis and wheezing.   Cardiovascular: Positive for orthopnea and leg swelling. Negative for chest pain and palpitations.  Gastrointestinal: Negative for abdominal pain, constipation, diarrhea, heartburn, nausea and vomiting.  Genitourinary: Negative for dysuria and hematuria.  Musculoskeletal: Negative for back pain and joint pain.  Skin: Negative for rash.  Neurological: Negative for sensory change, speech change, focal weakness and headaches.  Endo/Heme/Allergies: Does not bruise/bleed easily.  Psychiatric/Behavioral: Negative for depression. The patient is not nervous/anxious.     DRUG ALLERGIES:   Allergies  Allergen Reactions  . Penicillins Hives, Itching and Nausea And Vomiting  . Sulfa Antibiotics Hives, Itching and Nausea And Vomiting  . Aspirin Nausea And Vomiting and Nausea Only  . Tramadol Itching and Hives    hives hives hives  . Tramadol Hcl     VITALS:  Blood pressure (!) 152/73, pulse 65, temperature 98.6 F (37 C), temperature source Oral, resp. rate 18, height 5\' 5"  (1.651 m), weight 65.8 kg, SpO2 95 %.  PHYSICAL EXAMINATION:   Physical Exam  GENERAL:  85 y.o.-year-old patient lying in the bed with no acute distress.  EYES: Pupils equal, round, reactive to light and accommodation. No scleral icterus. Extraocular muscles intact.  HEENT: Head atraumatic,  normocephalic. Oropharynx and nasopharynx clear.  NECK:  Supple, no jugular venous distention. No thyroid enlargement, no tenderness.  LUNGS: Decreased air entry at the bases with crackles.  No wheezing. CARDIOVASCULAR: S1, S2 normal. No murmurs, rubs, or gallops.  ABDOMEN: Soft, nontender, nondistended. Bowel sounds present. No organomegaly or mass.  EXTREMITIES: No cyanosis, clubbing .  Bilateral lower extremity edema NEUROLOGIC: Cranial nerves II through XII are intact. No focal Motor or sensory deficits b/l.   PSYCHIATRIC: The patient is alert and oriented x 3.  SKIN: No obvious rash, lesion, or ulcer.   LABORATORY PANEL:   CBC Recent Labs  Lab 10/01/18 1356  WBC 5.9  HGB 10.4*  HCT 31.6*  PLT 174   ------------------------------------------------------------------------------------------------------------------ Chemistries  Recent Labs  Lab 10/02/18 0535  NA 139  K 3.7  CL 106  CO2 24  GLUCOSE 99  BUN 12  CREATININE 0.69  CALCIUM 8.8*   ------------------------------------------------------------------------------------------------------------------  Cardiac Enzymes No results for input(s): TROPONINI in the last 168 hours. ------------------------------------------------------------------------------------------------------------------  RADIOLOGY:  Ct Angio Chest Pe W And/or Wo Contrast  Result Date: 10/01/2018 CLINICAL DATA:  Shortness of breath. EXAM: CT ANGIOGRAPHY CHEST WITH CONTRAST TECHNIQUE: Multidetector CT imaging of the chest was performed using the standard protocol during bolus administration of intravenous contrast. Multiplanar CT image reconstructions and MIPs were obtained to evaluate the vascular anatomy. CONTRAST:  50mL OMNIPAQUE IOHEXOL 350 MG/ML SOLN COMPARISON:  Radiograph of same day.  CT scan of May 05, 2007. FINDINGS: Cardiovascular: Satisfactory opacification of the pulmonary arteries to the segmental level. No evidence of pulmonary  embolism. Mild cardiomegaly is noted. Right atrial enlargement is noted.  Mitral valve calcifications are noted. Atherosclerosis of thoracic aorta is noted without aneurysm formation. No pericardial effusion. Mediastinum/Nodes: Continued presence of probable large left-sided thyroid goiter is noted which causes displacement of trachea to the right. Esophagus is unremarkable. No adenopathy is noted. Lungs/Pleura: No pneumothorax is noted. Mild bilateral pleural effusions are noted with adjacent subsegmental atelectasis. Right upper lobe opacity is noted most consistent with atelectasis scarring or pneumonia. Upper Abdomen: No acute abnormality. Musculoskeletal: No chest wall abnormality. No acute or significant osseous findings. Review of the MIP images confirms the above findings. IMPRESSION: No definite evidence of pulmonary embolus. Mild cardiomegaly is noted with right atrial enlargement. Mild bilateral pleural effusions are noted with adjacent subsegmental atelectasis. Right upper lobe irregular opacity is noted most consistent with atelectasis, scarring or pneumonia. Continued radiographic or CT follow-up is recommended. Large probable left-sided thyroid goiter is noted which causes displacement of trachea to the right. Aortic Atherosclerosis (ICD10-I70.0). Electronically Signed   By: Lupita Raider M.D.   On: 10/01/2018 16:13   Dg Chest Port 1 View  Result Date: 10/01/2018 CLINICAL DATA:  Shortness of breath today EXAM: PORTABLE CHEST 1 VIEW COMPARISON:  September 06, 2018 FINDINGS: The heart size and mediastinal contours are stable. The heart size is enlarged. The mediastinal contour is stable. Consolidation of the right upper lobe is identified. There is diffuse increased pulmonary interstitium bilaterally. There is no pleural effusion. The visualized skeletal structures are stable. IMPRESSION: There is persistence of previously noted left upper lobe pneumonia. Cardiomegaly with increased pulmonary interstitium  identified bilaterally, consistent with congestive heart failure. Electronically Signed   By: Sherian Rein M.D.   On: 10/01/2018 14:53     ASSESSMENT AND PLAN:   85 y.o. female with a known history of atrial fibrillation, diabetes, osteoarthritis, asthma, osteoporosis, recent hospitalization for acute renal failure and suspected pneumonia who presents back to the hospital complaining of shortness of breath.  *Acute on chronic systolic congestive heart failure with known ejection fraction of 30% With acute hypoxic respiratory failure - IV Lasix, Beta blockers - Input and Output - Counseled to limit fluids and Salt - Monitor Bun/Cr and Potassium - Echo -Cardiology consulted and appreciate input  * Mild elevation troponin is due to demand ischemia from CHF.  No chest pain.  No acute changes on EKG.  * History of atrial fibrillation-rate controlled. -Continue Cardizem, digoxin. -  continue Pradaxa.  * COPD-no acute exacerbation.  Continue Dulera, albuterol inhaler as needed.  *  GERD-continue Protonix.  All the records are reviewed and case discussed with Care Management/Social Worker Management plans discussed with the patient, family and they are in agreement.  CODE STATUS: DNR/DNI  DVT Prophylaxis: SCDs  TOTAL TIME TAKING CARE OF THIS PATIENT: 35 minutes.   POSSIBLE D/C IN 1-2 DAYS, DEPENDING ON CLINICAL CONDITION.  Molinda Bailiff Margarett Viti M.D on 10/02/2018 at 10:41 AM  Between 7am to 6pm - Pager - [phone]  After 6pm go to [email or web address] - password [email or web address] [facility]  [facility]  Office  [phone]  CC: Primary care physician; Marguarite Arbour, MD  Note: This dictation was prepared with Dragon dictation along with smaller phrase technology. Any transcriptional errors that result from this process are unintentional.

## 2018-10-03 LAB — MAGNESIUM: Magnesium: 1.7 mg/dL (ref 1.7–2.4)

## 2018-10-03 LAB — BASIC METABOLIC PANEL
Anion gap: 8 (ref 5–15)
BUN: 16 mg/dL (ref 8–23)
CO2: 25 mmol/L (ref 22–32)
Calcium: 8.9 mg/dL (ref 8.9–10.3)
Chloride: 109 mmol/L (ref 98–111)
Creatinine, Ser: 0.82 mg/dL (ref 0.44–1.00)
GFR calc Af Amer: >60 mL/min (ref >60)
GFR calc non Af Amer: >60 mL/min (ref >60)
Glucose, Bld: 100 mg/dL — ABNORMAL HIGH (ref 70–99)
Potassium: 4 mmol/L (ref 3.5–5.1)
Sodium: 142 mmol/L (ref 135–145)

## 2018-10-03 MED ORDER — FUROSEMIDE 40 MG PO TABS: 40.0000 mg | ORAL_TABLET | Freq: Every day | ORAL | 0 refills | Status: AC

## 2018-10-03 MED ORDER — POTASSIUM CHLORIDE ER 20 MEQ PO TBCR: 20.0000 meq | EXTENDED_RELEASE_TABLET | Freq: Every day | ORAL | 0 refills | Status: AC

## 2018-10-03 NOTE — Progress Notes (Signed)
Patient Name: Tracy Acevedo Date of Encounter: 10/03/2018  Hospital Problem List     Active Problems:   CHF (congestive heart failure) [facility])    Patient Profile     Patient with history of moderate cardiomyopathy, COPD, atrial fibrillation admitted with volume overload.  Is diuresed well.  Breathing back to her baseline.  Anxious to go home.  Subjective   Feels better less short of breath.  Inpatient Medications    . dabigatran  150 mg Oral BID  . digoxin  0.125 mg Oral Daily  . diltiazem  360 mg Oral Daily  . feeding supplement (ENSURE ENLIVE)  1 Bottle Oral BID BM  . furosemide  20 mg Intravenous Q12H  . loratadine  10 mg Oral Daily  . losartan  100 mg Oral Daily  . mometasone-formoterol  2 puff Inhalation BID  . montelukast  10 mg Oral QHS  . pantoprazole  40 mg Oral Daily  . sodium chloride flush  3 mL Intravenous Q12H    Vital Signs    Vitals:   10/02/18 1822 10/02/18 1956 10/03/18 0444 10/03/18 0716  BP:  (!) 150/69 (!) 148/78 (!) 162/83  Pulse:  60 61 75  Resp:  20 18 19   Temp:  98.9 F (37.2 C) 98 F (36.7 C) 98.4 F (36.9 C)  TempSrc:  Oral Oral Oral  SpO2: 95% 93% 94% 96%  Weight:   62.2 kg   Height:        Intake/Output Summary (Last 24 hours) at 10/03/2018 1014 Last data filed at 10/03/2018 0958 Gross per 24 hour  Intake 243 ml  Output 2250 ml  Net -2007 ml   Filed Weights   10/01/18 1347 10/01/18 1900 10/03/18 0444  Weight: 65.3 kg 65.8 kg 62.2 kg    Physical Exam    GEN: Well nourished, well developed, in no acute distress.  HEENT: normal.  Neck: Supple, no JVD, carotid bruits, or masses. Cardiac: RRR, no murmurs, rubs, or gallops. No clubbing, cyanosis, edema.  Radials/DP/PT 2+ and equal bilaterally.  Respiratory:  Respirations regular and unlabored, clear to auscultation bilaterally. GI: Soft, nontender, nondistended, BS + x 4. MS: no deformity or atrophy. Skin: warm and dry, no rash. Neuro:  Strength and sensation are  intact. Psych: Normal affect.  Labs    CBC Recent Labs    10/01/18 1356  WBC 5.9  NEUTROABS 3.9  HGB 10.4*  HCT 31.6*  MCV 86.8  PLT 419   Basic Metabolic Panel Recent Labs    10/02/18 0535 10/03/18 0528  NA 139 142  K 3.7 4.0  CL 106 109  CO2 24 25  GLUCOSE 99 100*  BUN 12 16  CREATININE 0.69 0.82  CALCIUM 8.8* 8.9  MG  --  1.7   Liver Function Tests No results for input(s): AST, ALT, ALKPHOS, BILITOT, PROT, ALBUMIN in the last 72 hours. No results for input(s): LIPASE, AMYLASE in the last 72 hours. Cardiac Enzymes No results for input(s): CKTOTAL, CKMB, CKMBINDEX, TROPONINI in the last 72 hours. BNP Recent Labs    10/01/18 1402  BNP 1,389.0*   D-Dimer No results for input(s): DDIMER in the last 72 hours. Hemoglobin A1C No results for input(s): HGBA1C in the last 72 hours. Fasting Lipid Panel No results for input(s): CHOL, HDL, LDLCALC, TRIG, CHOLHDL, LDLDIRECT in the last 72 hours. Thyroid Function Tests No results for input(s): TSH, T4TOTAL, T3FREE, THYROIDAB in the last 72 hours.  Invalid input(s): [employer]  Telemetry  Atrial fibrillation with controlled ventricular response.  Left bundle branch block.  ECG    Atrial fibrillation with controlled ventricular response left bundle branch block.  Radiology    Ct Abdomen Pelvis Wo Contrast  Result Date: 09/06/2018 CLINICAL DATA:  Renal failure, shortness of breath. EXAM: CT ABDOMEN AND PELVIS WITHOUT CONTRAST TECHNIQUE: Multidetector CT imaging of the abdomen and pelvis was performed following the standard protocol without IV contrast. COMPARISON:  CT scan of September 22, 2007. FINDINGS: Lower chest: No acute abnormality. Hepatobiliary: No focal liver abnormality is seen. No gallstones, gallbladder wall thickening, or biliary dilatation. Pancreas: Unremarkable. No pancreatic ductal dilatation or surrounding inflammatory changes. Spleen: Normal in size without focal abnormality. Adrenals/Urinary Tract: Stable  left adrenal adenoma is noted. Right adrenal gland appears normal. No hydronephrosis or renal obstruction is noted. No renal or ureteral calculi are noted. Urinary bladder is unremarkable. Stomach/Bowel: Stomach is within normal limits. Appendix appears normal. No evidence of bowel wall thickening, distention, or inflammatory changes. Diverticulosis of sigmoid colon is noted without inflammation. Vascular/Lymphatic: Aortic atherosclerosis. No enlarged abdominal or pelvic lymph nodes. Reproductive: Uterus and bilateral adnexa are unremarkable. Other: No abdominal wall hernia or abnormality. No abdominopelvic ascites. Musculoskeletal: Status post bilateral total hip arthroplasties. No acute osseous abnormality is noted. IMPRESSION: No acute abnormality seen in the abdomen or pelvis. Sigmoid diverticulosis without inflammation. Stable left adrenal adenoma. Aortic Atherosclerosis (ICD10-I70.0). Electronically Signed   By: Lupita Raider M.D.   On: 09/06/2018 18:56   Ct Angio Chest Pe W And/or Wo Contrast  Result Date: 10/01/2018 CLINICAL DATA:  Shortness of breath. EXAM: CT ANGIOGRAPHY CHEST WITH CONTRAST TECHNIQUE: Multidetector CT imaging of the chest was performed using the standard protocol during bolus administration of intravenous contrast. Multiplanar CT image reconstructions and MIPs were obtained to evaluate the vascular anatomy. CONTRAST:  69mL OMNIPAQUE IOHEXOL 350 MG/ML SOLN COMPARISON:  Radiograph of same day.  CT scan of May 05, 2007. FINDINGS: Cardiovascular: Satisfactory opacification of the pulmonary arteries to the segmental level. No evidence of pulmonary embolism. Mild cardiomegaly is noted. Right atrial enlargement is noted. Mitral valve calcifications are noted. Atherosclerosis of thoracic aorta is noted without aneurysm formation. No pericardial effusion. Mediastinum/Nodes: Continued presence of probable large left-sided thyroid goiter is noted which causes displacement of trachea to the  right. Esophagus is unremarkable. No adenopathy is noted. Lungs/Pleura: No pneumothorax is noted. Mild bilateral pleural effusions are noted with adjacent subsegmental atelectasis. Right upper lobe opacity is noted most consistent with atelectasis scarring or pneumonia. Upper Abdomen: No acute abnormality. Musculoskeletal: No chest wall abnormality. No acute or significant osseous findings. Review of the MIP images confirms the above findings. IMPRESSION: No definite evidence of pulmonary embolus. Mild cardiomegaly is noted with right atrial enlargement. Mild bilateral pleural effusions are noted with adjacent subsegmental atelectasis. Right upper lobe irregular opacity is noted most consistent with atelectasis, scarring or pneumonia. Continued radiographic or CT follow-up is recommended. Large probable left-sided thyroid goiter is noted which causes displacement of trachea to the right. Aortic Atherosclerosis (ICD10-I70.0). Electronically Signed   By: Lupita Raider M.D.   On: 10/01/2018 16:13   US Renal  Result Date: 09/07/2018 CLINICAL DATA:  Acute renal failure EXAM: RENAL / URINARY TRACT ULTRASOUND COMPLETE COMPARISON:  CT abdomen pelvis 09/06/2018 FINDINGS: Right Kidney: Renal measurements: 11.1 x 3.9 x 4.2 cm = volume: 96 mL . Echogenicity within normal limits. No mass or hydronephrosis visualized. Left Kidney: Renal measurements: 10.8 x 5.2 x 4.8 cm = volume: 141  mL. Echogenicity within normal limits. No mass or hydronephrosis visualized. Bladder: Appears normal for degree of bladder distention. IMPRESSION: Negative renal ultrasound. Electronically Signed   By: Marlan Palauharles  Clark M.D.   On: 09/07/2018 10:34   Dg Chest Port 1 View  Result Date: 10/01/2018 CLINICAL DATA:  Shortness of breath today EXAM: PORTABLE CHEST 1 VIEW COMPARISON:  September 06, 2018 FINDINGS: The heart size and mediastinal contours are stable. The heart size is enlarged. The mediastinal contour is stable. Consolidation of the right upper  lobe is identified. There is diffuse increased pulmonary interstitium bilaterally. There is no pleural effusion. The visualized skeletal structures are stable. IMPRESSION: There is persistence of previously noted left upper lobe pneumonia. Cardiomegaly with increased pulmonary interstitium identified bilaterally, consistent with congestive heart failure. Electronically Signed   By: Sherian ReinWei-Chen  Lin M.D.   On: 10/01/2018 14:53   Dg Chest Portable 1 View  Result Date: 09/06/2018 CLINICAL DATA:  Renal failure.  Shortness of breath. EXAM: PORTABLE CHEST 1 VIEW COMPARISON:  September 22, 2007 FINDINGS: Focal infiltrate is seen in the right upper lobe. Cardiomegaly. The hila and mediastinum are stable. No other interval changes. IMPRESSION: Focal infiltrate in the right upper lobe worrisome for pneumonia or aspiration. Recommend short-term follow-up to ensure resolution. Electronically Signed   By: Gerome Samavid  Williams III M.D   On: 09/06/2018 16:12    Assessment & Plan    Atrial fibrillation-rate is well controlled.  We will continue with current regimen including dabigatran for anticoagulation, digoxin and diltiazem for rate control control..  We will follow-up as an outpatient next week  CHF-improved with diuresis.  Would continue Lasix at 20 mg daily.  Will adjust as as an outpatient.  Continue with afterload reduction with losartan at 100 mg daily.  Low-sodium diet and limiting all liquid intake to 32 ounces or less daily is recommended.  Cardiomyopathy-clinically stable.  Follow-up in our office in 1 week.  Okay for discharge from a cardiac standpoint.  Signed, Darlin PriestlyKenneth A. Pooja Camuso MD 10/03/2018, 10:14 AM  Pager: [phone]

## 2018-10-03 NOTE — Discharge Instructions (Signed)
°-   Daily fluids < 2 liters. °- Low salt diet °- Check weight everyday and keep log. Take to your doctors appt. °- Take extra dose of lasix if you gain more than 3 pounds weight. ° ° °

## 2018-10-03 NOTE — TOC Transition Note (Signed)
Transition of Care [facility]) - CM/SW Discharge Note   Patient Details  Name: Tracy Acevedo MRN: [id number] Date of Birth: 16-May-1925  Transition of Care [facility]) CM/SW Contact:  Latanya Maudlin, RN Phone Number: 10/03/2018, 12:29 PM   Clinical Narrative:    Patient to be discharged per MD order. Orders in place for home health services. Patient active with [facility] and wishes to resume. Notified Sarah of resumption orders. Patient prefers EMS for transport. No DME orders.       Final next level of care: Home w Home Health Services Barriers to Discharge: No Barriers Identified   Patient Goals and CMS Choice     Choice offered to / list presented to : Patient  Discharge Placement                       Discharge Plan and Services     Post Acute Care Choice: Resumption of Svcs/PTA Provider, Home Health                    HH Arranged: RN, PT [facility] Agency: [place] Date [place]: 10/03/18 Time [place]: 1228 Representative spoke with at [place]: [place] ([place]) Interventions     Readmission Risk Interventions Readmission Risk Prevention Plan 10/03/2018  Transportation Screening Complete  PCP or Specialist Appt within 5-7 Days Complete  Home Care Screening Complete  Medication Review (RN CM) Complete  Some recent data might be hidden

## 2018-10-10 NOTE — Discharge Summary (Signed)
[employer] - [place] at [facility]   PATIENT NAME: Tracy SprangBarbara Acevedo    MR#:  [id number]  DATE OF BIRTH:  01/21/1926  DATE OF ADMISSION:  10/01/2018 ADMITTING PHYSICIAN: Houston SirenVivek J Sainani, MD  DATE OF DISCHARGE: 10/03/2018  1:57 PM  PRIMARY CARE PHYSICIAN: Marguarite ArbourSparks, Jeffrey D, MD   ADMISSION DIAGNOSIS:  SOB (shortness of breath) [R06.02] Congestive heart failure, unspecified HF chronicity, unspecified heart failure type (HCC) [I50.9]  DISCHARGE DIAGNOSIS:  Active Problems:   CHF (congestive heart failure) (HCC)   SECONDARY DIAGNOSIS:   Past Medical History:  Diagnosis Date  . AF (atrial fibrillation) (HCC)   . Anemia   . Arthritis, rheumatoid (HCC)   . Asthma   . Bruises easily   . Diabetes (HCC)   . Hearing loss   . History of swelling of feet   . Memory loss   . Muscle cramp   . Muscle pain   . Osteoporosis      ADMITTING HISTORY  HISTORY OF PRESENT ILLNESS:  Tracy SprangBarbara Acevedo  is a 85 y.o. female with a known history of atrial fibrillation, diabetes, osteoarthritis, asthma, osteoporosis, recent hospitalization for acute renal failure and suspected pneumonia who presents back to the hospital complaining of shortness of breath.  Patient says her shortness of breath has progressively gotten worse over the past few days to a week.  She says she gets significantly short of breath on minimal exertion and even when talking.  She also noted that her ankles have been a bit more swollen than usual.  She denies any paroxysmal nocturnal dyspnea orthopnea.  She denies any fevers, chills, cough, loss of taste or any sick contacts.  Patient's COVID-19 test is negative.  Patient presented to the hospital was noted to be hypoxic with O2 sats in the mid 80s and improved with 2 L nasal cannula.  Patient's BNP was elevated and her chest x-ray findings are consistent with mild CHF.  Patient CT chest was negative for pulmonary embolism.  Hospitalist services were contacted for admission  for congestive heart failure.  HOSPITAL COURSE:   85 y.o.femalewith a known history of atrial fibrillation, diabetes, osteoarthritis, asthma, osteoporosis, recent hospitalization for acute renal failure and suspected pneumonia who presents back to the hospital complaining of shortness of breath.  *Acute on chronic systolic congestive heart failure with known ejection fraction of 30% With acute hypoxic respiratory failure Treated with IV Lasix.  Patient is on beta-blockers.  Seen by cardiology Dr. Lady GaryFath.  Diuresed well and her respiratory failure resolved.  Back on room air. Patient will be discharged home on oral Lasix and follow-up with CHF clinic.  * Mild elevation troponin is due to demand ischemia from CHF.  No chest pain.  No acute changes on EKG.  * History of atrial fibrillation-rate controlled. -Continue Cardizem, digoxin. -continue Pradaxa.  *COPD-no acute exacerbation. Continue Dulera, albuterol inhaler as needed.  * GERD-continue Protonix.  Patient discharged home in stable condition with Lasix and follow-up.  CONSULTS OBTAINED:  Treatment Team:  Dalia HeadingFath, Kenneth A, MD  DRUG ALLERGIES:   Allergies  Allergen Reactions  . Penicillins Hives, Itching and Nausea And Vomiting  . Sulfa Antibiotics Hives, Itching and Nausea And Vomiting  . Aspirin Nausea And Vomiting and Nausea Only  . Tramadol Itching and Hives    hives hives hives  . Tramadol Hcl     DISCHARGE MEDICATIONS:   Allergies as of 10/03/2018      Reactions   Penicillins Hives, Itching, Nausea And Vomiting  Sulfa Antibiotics Hives, Itching, Nausea And Vomiting   Aspirin Nausea And Vomiting, Nausea Only   Tramadol Itching, Hives   hives hives hives   Tramadol Hcl       Medication List    STOP taking these medications   predniSONE 10 MG tablet Commonly known as: DELTASONE     TAKE these medications   Biotin 10 MG Tabs Take 10 mg by mouth daily. Dose unknown   CALTRATE 600+D PO Take  1 tablet by mouth daily.   cetirizine 10 MG tablet Commonly known as: ZYRTEC Take 10 mg by mouth daily.   dabigatran 150 MG Caps capsule Commonly known as: PRADAXA Take 150 mg by mouth 2 (two) times daily.   digoxin 0.125 MG tablet Commonly known as: LANOXIN Take 0.125 mg by mouth daily.   diltiazem 360 MG 24 hr capsule Commonly known as: CARDIZEM CD Take 360 mg by mouth daily.   EpiPen 0.3 mg/0.3 mL Soaj injection Generic drug: EPINEPHrine Inject 0.3 mg into the muscle once.   esomeprazole 40 MG capsule Commonly known as: NEXIUM Take 40 mg by mouth daily at 12 noon.   eszopiclone 3 MG Tabs Generic drug: Eszopiclone Take 3 mg by mouth at bedtime as needed (sleep). Take immediately before bedtime   feeding supplement (GLUCERNA SHAKE) Liqd Take 237 mLs by mouth 3 (three) times daily between meals.   Fluticasone-Salmeterol 100-50 MCG/DOSE Aepb Commonly known as: ADVAIR Inhale 1 puff into the lungs 2 (two) times daily.   furosemide 40 MG tablet Commonly known as: Lasix Take 1 tablet (40 mg total) by mouth daily.   Glucosamine-Chondroitin-MSM 500-400-300 MG Tabs Take 1 tablet by mouth daily.   losartan 100 MG tablet Commonly known as: COZAAR Take 100 mg by mouth daily.   montelukast 10 MG tablet Commonly known as: SINGULAIR Take 10 mg by mouth at bedtime.   Nasacort Allergy 24HR 55 MCG/ACT Aero nasal inhaler Generic drug: triamcinolone 2 sprays daily.   Potassium Chloride ER 20 MEQ Tbcr Take 20 mEq by mouth daily.   PROBIOTIC DAILY PO Take 1 capsule by mouth daily.   SAM-E PO Take 1 tablet by mouth daily.   Ventolin HFA 108 (90 Base) MCG/ACT inhaler Generic drug: albuterol Inhale 2 puffs into the lungs every 6 (six) hours as needed for wheezing or shortness of breath.       Today   VITAL SIGNS:  Blood pressure (!) 160/71, pulse 66, temperature 98.8 F (37.1 C), temperature source Oral, resp. rate 16, height 5\' 5"  (1.651 m), weight 62.2 kg, SpO2  100 %.  I/O:  No intake or output data in the 24 hours ending 10/10/18 1307  PHYSICAL EXAMINATION:  Physical Exam  GENERAL:  85 y.o.-year-old patient lying in the bed with no acute distress.  LUNGS: Normal breath sounds bilaterally, no wheezing, rales,rhonchi or crepitation. No use of accessory muscles of respiration.  CARDIOVASCULAR: S1, S2 normal. No murmurs, rubs, or gallops.  ABDOMEN: Soft, non-tender, non-distended. Bowel sounds present. No organomegaly or mass.  NEUROLOGIC: Moves all 4 extremities. PSYCHIATRIC: The patient is alert and oriented x 3.  SKIN: No obvious rash, lesion, or ulcer.   DATA REVIEW:   CBC No results for input(s): WBC, HGB, HCT, PLT in the last 168 hours.  Chemistries  No results for input(s): NA, K, CL, CO2, GLUCOSE, BUN, CREATININE, CALCIUM, MG, AST, ALT, ALKPHOS, BILITOT in the last 168 hours.  Invalid input(s): GFRCGP  Cardiac Enzymes No results for input(s): TROPONINI in the last  168 hours.  Microbiology Results  Results for orders placed or performed during the hospital encounter of 10/01/18  SARS Coronavirus 2 (CEPHEID - Performed in [facility] hospital lab), Hosp Order     Status: None   Collection Time: 10/01/18  3:47 PM   Specimen: Nasopharyngeal Swab  Result Value Ref Range Status   SARS Coronavirus 2 NEGATIVE NEGATIVE Final    Comment: (NOTE) If result is NEGATIVE SARS-CoV-2 target nucleic acids are NOT DETECTED. The SARS-CoV-2 RNA is generally detectable in upper and lower  respiratory specimens during the acute phase of infection. The lowest  concentration of SARS-CoV-2 viral copies this assay can detect is 250  copies / mL. A negative result does not preclude SARS-CoV-2 infection  and should not be used as the sole basis for treatment or other  patient management decisions.  A negative result may occur with  improper specimen collection / handling, submission of specimen other  than nasopharyngeal swab, presence of viral  mutation(s) within the  areas targeted by this assay, and inadequate number of viral copies  (<250 copies / mL). A negative result must be combined with clinical  observations, patient history, and epidemiological information. If result is POSITIVE SARS-CoV-2 target nucleic acids are DETECTED. The SARS-CoV-2 RNA is generally detectable in upper and lower  respiratory specimens dur ing the acute phase of infection.  Positive  results are indicative of active infection with SARS-CoV-2.  Clinical  correlation with patient history and other diagnostic information is  necessary to determine patient infection status.  Positive results do  not rule out bacterial infection or co-infection with other viruses. If result is PRESUMPTIVE POSTIVE SARS-CoV-2 nucleic acids MAY BE PRESENT.   A presumptive positive result was obtained on the submitted specimen  and confirmed on repeat testing.  While 2019 novel coronavirus  (SARS-CoV-2) nucleic acids may be present in the submitted sample  additional confirmatory testing may be necessary for epidemiological  and / or clinical management purposes  to differentiate between  SARS-CoV-2 and other Sarbecovirus currently known to infect humans.  If clinically indicated additional testing with an alternate test  methodology [phone]) is advised. The SARS-CoV-2 RNA is generally  detectable in upper and lower respiratory sp ecimens during the acute  phase of infection. The expected result is Negative. Fact Sheet for Patients:  StrictlyIdeas.no Fact Sheet for Healthcare Providers: BankingDealers.co.za This test is not yet approved or cleared by the [place] FDA and has been authorized for detection and/or diagnosis of SARS-CoV-2 by FDA under an Emergency Use Authorization (EUA).  This EUA will remain in effect (meaning this test can be used) for the duration of the COVID-19 declaration under Section 564(b)(1)  of the Act, 21 U.S.C. section 360bbb-3(b)(1), unless the authorization is terminated or revoked sooner. Performed at [facility], [address], [place], [place] [address]     RADIOLOGY:  No results found.  Follow up with PCP in 1 week.  Management plans discussed with the patient, family and they are in agreement.  CODE STATUS:  Code Status History    Date Active Date Inactive Code Status Order ID Comments User Context   10/01/2018 1739 10/03/2018 1702 DNR [id number]  Henreitta Leber, MD ED   09/06/2018 2036 09/09/2018 2339 Full Code [id number]  Salary, Avel Peace, MD Inpatient   Advance Care Planning Activity    Questions for Most Recent Historical Code Status (Order [id number])    Question Answer Comment   In the event of cardiac  or respiratory ARREST Do not call a "code blue"    In the event of cardiac or respiratory ARREST Do not perform Intubation, CPR, defibrillation or ACLS    In the event of cardiac or respiratory ARREST Use medication by any route, position, wound care, and other measures to relive pain and suffering. May use oxygen, suction and manual treatment of airway obstruction as needed for comfort.         Advance Directive Documentation     Most Recent Value  Type of Advance Directive  Healthcare Power of Attorney, Living will  Pre-existing out of facility DNR order (yellow form or pink MOST form)  -  "MOST" Form in Place?  -      TOTAL TIME TAKING CARE OF THIS PATIENT ON DAY OF DISCHARGE: more than 30 minutes.   Molinda Bailiff Markez Dowland M.D on 10/10/2018 at 1:07 PM  Between 7am to 6pm - Pager - [phone]  After 6pm go to [email or web address] - password [email or web address] [facility]  [facility] [place] [facility]  Office  [phone]  CC: Primary care physician; Marguarite Arbour, MD  Note: This dictation was prepared with Dragon dictation along with smaller phrase technology. Any transcriptional errors that result from this process are unintentional.

## 2018-10-21 ENCOUNTER — Other Ambulatory Visit: Payer: Self-pay

## 2018-10-21 ENCOUNTER — Encounter: Payer: Self-pay | Admitting: Podiatry

## 2018-10-21 ENCOUNTER — Ambulatory Visit (INDEPENDENT_AMBULATORY_CARE_PROVIDER_SITE_OTHER): Payer: Medicare Other | Admitting: Podiatry

## 2018-10-21 VITALS — HR 98

## 2018-10-21 DIAGNOSIS — M79674 Pain in right toe(s): Secondary | ICD-10-CM | POA: Diagnosis not present

## 2018-10-21 DIAGNOSIS — B351 Tinea unguium: Secondary | ICD-10-CM

## 2018-10-21 DIAGNOSIS — M79675 Pain in left toe(s): Secondary | ICD-10-CM | POA: Diagnosis not present

## 2018-10-21 DIAGNOSIS — E119 Type 2 diabetes mellitus without complications: Secondary | ICD-10-CM | POA: Diagnosis not present

## 2018-10-21 DIAGNOSIS — L84 Corns and callosities: Secondary | ICD-10-CM | POA: Diagnosis not present

## 2018-10-21 DIAGNOSIS — Q828 Other specified congenital malformations of skin: Secondary | ICD-10-CM

## 2018-10-21 NOTE — Progress Notes (Signed)
Complaint:  Visit Type: Patient returns to my office for continued preventative foot care services. Complaint: Patient states" my calluses have grown long and thick and painful both feet." Patient has been diagnosed with DM with no foot complications. The patient presents for preventative foot care services. No changes to ROS  Podiatric Exam: Vascular: dorsalis pedis and posterior tibial pulses are palpable bilateral. Capillary return is immediate. Temperature gradient is WNL. Skin turgor WNL  Sensorium: Normal Semmes Weinstein monofilament test. Normal tactile sensation bilaterally. Nail Exam: Pt has thick disfigured discolored nails with subungual debris noted bilateral entire nail hallux through fifth toenails Ulcer Exam: There is no evidence of ulcer or pre-ulcerative changes or infection. Orthopedic Exam: Muscle tone and strength are WNL. No limitations in general ROM. No crepitus or effusions noted. Foot type and digits show no abnormalities. Bony prominences are unremarkable. Skin:  Porokeratosis sub 1,5 left and clavi second and third  left.  Porokeratosis sub 2,5 right and clavi 3rd right.  Hel[place] durum 5th toe B/L No infection or ulcers.  Porokeratosis  Fifth metabase right foot. Diagnosis:  Porokeratosis  B/L  Treatment & Plan Procedures and Treatment: Consent by patient was obtained for treatment procedures. The patient understood the discussion of treatment and procedures well. All questions were answered thoroughly reviewed. Debridement of multiple porokeratosis  B/L No ulceration, no infection noted.  Return Visit-Office Procedure: Patient instructed to return to the office for a follow up visit 9 weeks  for continued evaluation and treatment.    Gardiner Barefoot DPM

## 2018-10-22 NOTE — Progress Notes (Signed)
Patient ID: Tracy Acevedo, female    DOB: 09-12-1925, 85 y.o.   MRN: [id number]  HPI  Tracy Acevedo is a 85 y/o female with a history of DM, asthma, HTN, atrial fibrillation, COPD, anemia, previous tobacco use and chronic heart failure.  Echo report from 10/02/2018 reviewed and showed an EF of 35-40% along with mild/moderate MR and mild AR.   Admitted 10/01/2018 due to acute on chronic heart failure. Cardiology consult obtained. Initially given IV lasix and then transitioned to oral diuretics. Elevated troponin thought to be due to demand ischemia. Discharged after 2 days.  Patient presents for her initial visit with a chief complaint of moderate shortness of breath upon minimal exertion. She describes this as chronic in nature having been present for several years. She has associated fatigue, wheezing, pedal edema, palpitations and chronic difficulty sleeping along with this.   Past Medical History:  Diagnosis Date  . AF (atrial fibrillation) ([place])   . Anemia   . Arthritis, rheumatoid ([place])   . Asthma   . Bruises easily   . Diabetes ([place])   . Hearing loss   . History of swelling of feet   . Memory loss   . Muscle cramp   . Muscle pain   . Osteoporosis    Past Surgical History:  Procedure Laterality Date  . COLONOSCOPY    . EYE SURGERY Right   . GALLBLADDER SURGERY    . HIP SURGERY Bilateral   . KNEE SURGERY Right    Family History  Problem Relation Age of Onset  . Pancreatic cancer Mother   . Tuberculosis Father    Social History   Tobacco Use  . Smoking status: Former Smoker    Packs/day: 0.25    Years: 15.00    Pack years: 3.75    Types: Cigarettes  . Smokeless tobacco: Never Used  Substance Use Topics  . Alcohol use: Yes    Comment: social   Allergies  Allergen Reactions  . Penicillins Hives, Itching and Nausea And Vomiting  . Sulfa Antibiotics Hives, Itching and Nausea And Vomiting  . Aspirin Nausea And Vomiting and Nausea Only  . Tramadol Itching and Hives     hives hives hives  . Tramadol Hcl    Prior to Admission medications   Medication Sig Start Date End Date Taking? Authorizing Provider  albuterol (VENTOLIN HFA) 108 (90 BASE) MCG/ACT inhaler Inhale 2 puffs into the lungs every 6 (six) hours as needed for wheezing or shortness of breath.    Yes [provider]  Biotin 10 MG TABS Take 10 mg by mouth daily. Dose unknown    Yes [provider]  Calcium Carbonate-Vitamin D (CALTRATE 600+D PO) Take 1 tablet by mouth daily.   Yes [provider]  cetirizine (ZYRTEC) 10 MG tablet Take 10 mg by mouth daily.   Yes [provider]  dabigatran (PRADAXA) 150 MG CAPS capsule Take 150 mg by mouth 2 (two) times daily.   Yes [provider]  digoxin (LANOXIN) 0.125 MG tablet Take 0.125 mg by mouth daily.   Yes [provider]  diltiazem (CARDIZEM CD) 360 MG 24 hr capsule Take 360 mg by mouth daily.  12/25/17  Yes [provider]  EPINEPHrine (EPIPEN) 0.3 mg/0.3 mL SOAJ injection Inject 0.3 mg into the muscle once.   Yes [provider]  esomeprazole (NEXIUM) 40 MG capsule Take 40 mg by mouth daily at 12 noon.   Yes [provider]  Eszopiclone (  ESZOPICLONE) 3 MG TABS Take 3 mg by mouth at bedtime as needed (sleep). Take immediately before bedtime    Yes [provider]  feeding supplement, GLUCERNA SHAKE, (GLUCERNA SHAKE) LIQD Take 237 mLs by mouth 3 (three) times daily between meals. 09/09/18  Yes Enedina Finner, MD  Fluticasone-Salmeterol (ADVAIR) 100-50 MCG/DOSE AEPB Inhale 1 puff into the lungs 2 (two) times daily.    Yes [provider]  furosemide (LASIX) 40 MG tablet Take 1 tablet (40 mg total) by mouth daily. 10/03/18 10/03/19 Yes Sudini, Wardell Heath, MD  Glucosamine-Chondroitin-MSM 500-400-300 MG TABS Take 1 tablet by mouth daily.    Yes [provider]  losartan (COZAAR) 100 MG tablet Take 100 mg by mouth daily.   Yes [provider]  montelukast  (SINGULAIR) 10 MG tablet Take 10 mg by mouth at bedtime.   Yes [provider]  NASACORT ALLERGY 24HR 55 MCG/ACT AERO nasal inhaler 2 sprays daily. 03/13/18  Yes [provider]  potassium chloride 20 MEQ TBCR Take 20 mEq by mouth daily. 10/03/18  Yes Sudini, Wardell Heath, MD  Probiotic Product (PROBIOTIC DAILY PO) Take 1 capsule by mouth daily.   Yes [provider]  S-Adenosylmethionine (SAM-E PO) Take 1 tablet by mouth daily.   Yes [provider]    Review of Systems  Constitutional: Positive for fatigue. Negative for appetite change.  HENT: Positive for hearing loss. Negative for congestion, postnasal drip and sore throat.   Eyes: Negative.   Respiratory: Positive for shortness of breath (easily) and wheezing (at times). Negative for cough and chest tightness.   Cardiovascular: Positive for palpitations (at times) and leg swelling. Negative for chest pain.  Gastrointestinal: Negative for abdominal distention and abdominal pain.  Endocrine: Negative.   Genitourinary: Negative.   Musculoskeletal: Positive for arthralgias (knee). Negative for back pain.  Skin: Negative.   Allergic/Immunologic: Negative.   Neurological: Negative for dizziness and light-headedness.  Hematological: Negative for adenopathy. Does not bruise/bleed easily.  Psychiatric/Behavioral: Positive for sleep disturbance (trouble falling asleep). Negative for dysphoric mood. The patient is not nervous/anxious.    Vitals:   10/25/18 1016  BP: (!) 162/72  Pulse: 73  Resp: 18  SpO2: 100%  Weight: 138 lb 2 oz (62.7 kg)  Height: 5\' 5"  (1.651 m)   Wt Readings from Last 3 Encounters:  10/25/18 138 lb 2 oz (62.7 kg)  10/03/18 137 lb 3.2 oz (62.2 kg)  09/06/18 144 lb (65.3 kg)   Lab Results  Component Value Date   CREATININE 0.82 10/03/2018   CREATININE 0.69 10/02/2018   CREATININE 0.64 10/01/2018    Physical Exam Vitals signs and nursing note reviewed.  Constitutional:       Appearance: She is well-developed.  HENT:     Head: Normocephalic and atraumatic.     Right Ear: Decreased hearing noted.     Left Ear: Decreased hearing noted.  Cardiovascular:     Rate and Rhythm: Normal rate and regular rhythm.  Pulmonary:     Effort: Pulmonary effort is normal. No respiratory distress.     Breath sounds: Examination of the right-upper field reveals wheezing. Examination of the left-upper field reveals wheezing. Examination of the right-lower field reveals wheezing. Wheezing present. No rales.  Musculoskeletal:     Right lower leg: She exhibits no tenderness. Edema (trace pitting) present.     Left lower leg: She exhibits no tenderness. No edema.  Skin:    General: Skin is warm and dry.  Neurological:  General: No focal deficit present.     Mental Status: She is alert and oriented to person, place, and time.  Psychiatric:        Mood and Affect: Mood normal.        Behavior: Behavior normal.    Assessment & Plan:  1: Chronic heart failure with reduced ejection fraction- - NYHA class class III - euvolemic today - weighing daily and she was instructed to call for an overnight weight gain of >2 pounds or a weekly weight gain of >5 pounds - not adding salt and she says that the person that cooks for her doesn't cook with salt. Low sodium information and cookbook provided to patient - discussed changing her losartan to entresto in the future  - BNP 10/01/2018 was 1389.0  2: Atrial fibrillation- - saw cardiology (Fath) 10/12/2018 - patient on dilitazem which isn't indicated in reduced HF but will defer to cardiologist since this may be controlling her atrial fibrillation  3: COPD- - saw pulmonology Meredeth Ide) 09/23/2018 - few wheezes heard today  4: HTN- - BP elevated but she hasn't taken her medications yet today; encouraged her to take her medications prior to coming to the office - saw PCP ([place]) 09/15/2018 - BMP from 10/12/2018 reviewed and showed sodium  139, potassium 4.8, creatinine 0.9 and GFR 71  5: Lymphedema- - stage 2 - limited in her ability to exercise due to her knee/ hip pain - has worn compression socks in the past but she has great difficulty in getting them on and off - does elevate her legs during the day but edema persists and worsens as the day goes on - will refer for lymphapress compression boots  Medication list was reviewed.  Return in 6 weeks or sooner for any questions/problems before then.

## 2018-10-25 ENCOUNTER — Ambulatory Visit: Payer: Medicare Other | Attending: Family | Admitting: Family

## 2018-10-25 ENCOUNTER — Other Ambulatory Visit: Payer: Self-pay

## 2018-10-25 ENCOUNTER — Encounter: Payer: Self-pay | Admitting: Family

## 2018-10-25 VITALS — BP 162/72 | HR 73 | Resp 18 | Ht 65.0 in | Wt 138.1 lb

## 2018-10-25 DIAGNOSIS — M25559 Pain in unspecified hip: Secondary | ICD-10-CM | POA: Diagnosis not present

## 2018-10-25 DIAGNOSIS — I509 Heart failure, unspecified: Secondary | ICD-10-CM | POA: Diagnosis present

## 2018-10-25 DIAGNOSIS — Z7951 Long term (current) use of inhaled steroids: Secondary | ICD-10-CM | POA: Insufficient documentation

## 2018-10-25 DIAGNOSIS — Z800 Family history of malignant neoplasm of digestive organs: Secondary | ICD-10-CM | POA: Insufficient documentation

## 2018-10-25 DIAGNOSIS — Z87891 Personal history of nicotine dependence: Secondary | ICD-10-CM | POA: Insufficient documentation

## 2018-10-25 DIAGNOSIS — Z880 Allergy status to penicillin: Secondary | ICD-10-CM | POA: Insufficient documentation

## 2018-10-25 DIAGNOSIS — Z7901 Long term (current) use of anticoagulants: Secondary | ICD-10-CM | POA: Diagnosis not present

## 2018-10-25 DIAGNOSIS — M069 Rheumatoid arthritis, unspecified: Secondary | ICD-10-CM | POA: Insufficient documentation

## 2018-10-25 DIAGNOSIS — I4891 Unspecified atrial fibrillation: Secondary | ICD-10-CM | POA: Insufficient documentation

## 2018-10-25 DIAGNOSIS — Z79899 Other long term (current) drug therapy: Secondary | ICD-10-CM | POA: Diagnosis not present

## 2018-10-25 DIAGNOSIS — I10 Essential (primary) hypertension: Secondary | ICD-10-CM

## 2018-10-25 DIAGNOSIS — I480 Paroxysmal atrial fibrillation: Secondary | ICD-10-CM

## 2018-10-25 DIAGNOSIS — Z888 Allergy status to other drugs, medicaments and biological substances status: Secondary | ICD-10-CM | POA: Diagnosis not present

## 2018-10-25 DIAGNOSIS — Z886 Allergy status to analgesic agent status: Secondary | ICD-10-CM | POA: Diagnosis not present

## 2018-10-25 DIAGNOSIS — H9190 Unspecified hearing loss, unspecified ear: Secondary | ICD-10-CM | POA: Insufficient documentation

## 2018-10-25 DIAGNOSIS — M25569 Pain in unspecified knee: Secondary | ICD-10-CM | POA: Insufficient documentation

## 2018-10-25 DIAGNOSIS — Z882 Allergy status to sulfonamides status: Secondary | ICD-10-CM | POA: Diagnosis not present

## 2018-10-25 DIAGNOSIS — I5022 Chronic systolic (congestive) heart failure: Secondary | ICD-10-CM | POA: Insufficient documentation

## 2018-10-25 DIAGNOSIS — R413 Other amnesia: Secondary | ICD-10-CM | POA: Insufficient documentation

## 2018-10-25 DIAGNOSIS — J449 Chronic obstructive pulmonary disease, unspecified: Secondary | ICD-10-CM | POA: Insufficient documentation

## 2018-10-25 DIAGNOSIS — I110 Hypertensive heart disease with heart failure: Secondary | ICD-10-CM | POA: Insufficient documentation

## 2018-10-25 DIAGNOSIS — I890 Lymphedema, not elsewhere classified: Secondary | ICD-10-CM | POA: Diagnosis not present

## 2018-10-25 DIAGNOSIS — J42 Unspecified chronic bronchitis: Secondary | ICD-10-CM

## 2018-10-25 DIAGNOSIS — E119 Type 2 diabetes mellitus without complications: Secondary | ICD-10-CM | POA: Diagnosis not present

## 2018-10-25 NOTE — Patient Instructions (Addendum)
Continue weighing daily and call for an overnight weight gain of > 2 pounds or a weekly weight gain of >5 pounds.  Consider changing losartan to entresto.

## 2018-12-03 NOTE — Progress Notes (Signed)
Patient ID: Tracy Acevedo, female    DOB: [birth year], 85 y.o.   MRN: [id number]  HPI  Tracy Acevedo is a 85 y/o female with a history of DM, asthma, HTN, atrial fibrillation, COPD, anemia, previous tobacco use and chronic heart failure.  Echo report from 10/02/2018 reviewed and showed an EF of 35-40% along with mild/moderate MR and mild AR.   Admitted 10/01/2018 due to acute on chronic heart failure. Cardiology consult obtained. Initially given IV lasix and then transitioned to oral diuretics. Elevated troponin thought to be due to demand ischemia. Discharged after 2 days.  Patient presents for a follow-up visit with a chief complaint of moderate shortness of breath upon minimal exertion. She describes this as chronic in nature having been present for several years. She has associated fatigue, pedal edema, difficulty sleeping and slight weight gain along with this. She denies any dizziness, abdominal distention, palpitations, chest pain or cough.   She says that the swelling in her legs goes completely down overnight after she's been in the bed. She says that her cardiologist told her that she didn't need to wear the compression boots.   Past Medical History:  Diagnosis Date  . AF (atrial fibrillation) ([facility])   . Anemia   . Arthritis, rheumatoid ([facility])   . Asthma   . Bruises easily   . CHF (congestive heart failure) ([facility])   . COPD (chronic obstructive pulmonary disease) ([facility])   . Diabetes ([facility])   . Hearing loss   . History of swelling of feet   . Hypertension   . Memory loss   . Muscle cramp   . Muscle pain   . Osteoporosis    Past Surgical History:  Procedure Laterality Date  . COLONOSCOPY    . EYE SURGERY Right   . GALLBLADDER SURGERY    . HIP SURGERY Bilateral   . KNEE SURGERY Right    Family History  Problem Relation Age of Onset  . Pancreatic cancer Mother   . Tuberculosis Father    Social History   Tobacco Use  . Smoking status: Former Smoker    Packs/day: 0.25   Years: 15.00    Pack years: 3.75    Types: Cigarettes  . Smokeless tobacco: Never Used  Substance Use Topics  . Alcohol use: Yes    Comment: social   Allergies  Allergen Reactions  . Penicillins Hives, Itching and Nausea And Vomiting  . Sulfa Antibiotics Hives, Itching and Nausea And Vomiting  . Aspirin Nausea And Vomiting and Nausea Only  . Tramadol Itching and Hives    hives hives hives  . Tramadol Hcl    Prior to Admission medications   Medication Sig Start Date End Date Taking? Authorizing Provider  albuterol (VENTOLIN HFA) 108 (90 BASE) MCG/ACT inhaler Inhale 2 puffs into the lungs every 6 (six) hours as needed for wheezing or shortness of breath.    Yes [provider]  Biotin 10 MG TABS Take 10 mg by mouth daily. Dose unknown    Yes [provider]  Calcium Carbonate-Vitamin D (CALTRATE 600+D PO) Take 1 tablet by mouth daily.   Yes [provider]  cetirizine (ZYRTEC) 10 MG tablet Take 10 mg by mouth daily.   Yes [provider]  dabigatran (PRADAXA) 150 MG CAPS capsule Take 150 mg by mouth 2 (two) times daily.   Yes [provider]  digoxin (LANOXIN) 0.125 MG tablet Take 0.125 mg by mouth daily.   Yes [provider]  diltiazem (CARDIZEM CD) 360 MG 24 hr capsule Take 360 mg by mouth daily.  12/25/17  Yes [provider]  EPINEPHrine (EPIPEN) 0.3 mg/0.3 mL SOAJ injection Inject 0.3 mg into the muscle once.   Yes [provider]  esomeprazole (NEXIUM) 40 MG capsule Take 40 mg by mouth daily at 12 noon.   Yes [provider]  Eszopiclone (ESZOPICLONE) 3 MG TABS Take 3 mg by mouth at bedtime as needed (sleep). Take immediately before bedtime    Yes [provider]  feeding supplement, GLUCERNA SHAKE, (GLUCERNA SHAKE) LIQD Take 237 mLs by mouth 3 (three) times daily between meals. 09/09/18  Yes Enedina Finner, MD  Fluticasone-Salmeterol (ADVAIR) 100-50 MCG/DOSE AEPB Inhale 1 puff into the lungs 2  (two) times daily.    Yes [provider]  furosemide (LASIX) 40 MG tablet Take 1 tablet (40 mg total) by mouth daily. 10/03/18 10/03/19 Yes Sudini, Wardell Heath, MD  Glucosamine-Chondroitin-MSM 500-400-300 MG TABS Take 1 tablet by mouth daily.    Yes [provider]  losartan (COZAAR) 100 MG tablet Take 100 mg by mouth daily.   Yes [provider]  montelukast (SINGULAIR) 10 MG tablet Take 10 mg by mouth at bedtime.   Yes [provider]  NASACORT ALLERGY 24HR 55 MCG/ACT AERO nasal inhaler 2 sprays daily. 03/13/18  Yes [provider]  potassium chloride 20 MEQ TBCR Take 20 mEq by mouth daily. 10/03/18  Yes Sudini, Wardell Heath, MD  Probiotic Product (PROBIOTIC DAILY PO) Take 1 capsule by mouth daily.   Yes [provider]  S-Adenosylmethionine (SAM-E PO) Take 1 tablet by mouth daily.   Yes [provider]     Review of Systems  Constitutional: Positive for fatigue. Negative for appetite change.  HENT: Positive for hearing loss. Negative for congestion, postnasal drip and sore throat.   Eyes: Negative.   Respiratory: Positive for shortness of breath (easily). Negative for cough, chest tightness and wheezing.   Cardiovascular: Positive for leg swelling. Negative for chest pain and palpitations.  Gastrointestinal: Negative for abdominal distention and abdominal pain.  Endocrine: Negative.   Genitourinary: Negative.   Musculoskeletal: Positive for arthralgias (knee). Negative for back pain.  Skin: Negative.   Allergic/Immunologic: Negative.   Neurological: Positive for numbness (neuropathy in feet). Negative for dizziness and light-headedness.  Hematological: Negative for adenopathy. Does not bruise/bleed easily.  Psychiatric/Behavioral: Positive for sleep disturbance (trouble falling asleep). Negative for dysphoric mood. The patient is not nervous/anxious.    Vitals:   12/07/18 1022  BP: (!) 156/70  Pulse: 79  Resp: 18  SpO2: 97%  Weight:  144 lb (65.3 kg)  Height: 5\' 5"  (1.651 m)   Wt Readings from Last 3 Encounters:  12/07/18 144 lb (65.3 kg)  10/25/18 138 lb 2 oz (62.7 kg)  10/03/18 137 lb 3.2 oz (62.2 kg)   Lab Results  Component Value Date   CREATININE 0.82 10/03/2018   CREATININE 0.69 10/02/2018   CREATININE 0.64 10/01/2018     Physical Exam Vitals signs and nursing note reviewed.  Constitutional:      Appearance: She is well-developed.  HENT:     Head: Normocephalic and atraumatic.     Right Ear: Decreased hearing noted.     Left Ear: Decreased hearing noted.  Cardiovascular:     Rate and Rhythm: Normal rate and regular rhythm.  Pulmonary:     Effort: Pulmonary effort is normal. No respiratory distress.     Breath sounds: Examination of the left-upper field  reveals wheezing. Wheezing present. No rales.  Musculoskeletal:     Right lower leg: She exhibits no tenderness. Edema (2+ pitting) present.     Left lower leg: She exhibits no tenderness. Edema (1+ pitting) present.  Skin:    General: Skin is warm and dry.  Neurological:     General: No focal deficit present.     Mental Status: She is alert and oriented to person, place, and time.  Psychiatric:        Mood and Affect: Mood normal.        Behavior: Behavior normal.    Assessment & Plan:  1: Chronic heart failure with reduced ejection fraction- - NYHA class class III - euvolemic today - weighing daily and she was instructed to call for an overnight weight gain of >2 pounds or a weekly weight gain of >5 pounds - weight up 6 from last visit here 6 weeks ago - not adding salt and she says that the person that cooks for her doesn't cook with salt. - patient is not interested in making any medication changes today and prefers her cardiologist to manage this - BNP 10/01/2018 was 1389.0  2: Atrial fibrillation- - saw cardiology Minette Brine) 8/122020 - patient on dilitazem which isn't indicated in reduced HF but will defer to cardiologist since this  may be controlling her atrial fibrillation  3: COPD- - saw pulmonology Raul Del) 10/21/2018  4: HTN- - BP mildly elevated today - saw PCP ([place]) 10/26/2018 - BMP from 10/12/2018 reviewed and showed sodium 139, potassium 4.8, creatinine 0.9 and GFR 71  5: Lymphedema- - stage 2 - limited in her ability to exercise due to her knee/ hip pain - has worn compression socks in the past but she has great difficulty in getting them on and off - does elevate her legs during the day but edema persists and worsens as the day goes on - referral was made at last visit for lymphapress compression boots but patient says that her cardiologist said that she didn't need them  Medication list was reviewed.  Patient opts to not make a return appointment at this time. Advised her that she could call back at anytime to make a follow-up appointment.

## 2018-12-07 ENCOUNTER — Encounter: Payer: Self-pay | Admitting: Family

## 2018-12-07 ENCOUNTER — Other Ambulatory Visit: Payer: Self-pay

## 2018-12-07 ENCOUNTER — Ambulatory Visit: Payer: Medicare Other | Attending: Family | Admitting: Family

## 2018-12-07 VITALS — BP 156/70 | HR 79 | Resp 18 | Ht 65.0 in | Wt 144.0 lb

## 2018-12-07 DIAGNOSIS — I4891 Unspecified atrial fibrillation: Secondary | ICD-10-CM | POA: Insufficient documentation

## 2018-12-07 DIAGNOSIS — G479 Sleep disorder, unspecified: Secondary | ICD-10-CM | POA: Diagnosis not present

## 2018-12-07 DIAGNOSIS — I5022 Chronic systolic (congestive) heart failure: Secondary | ICD-10-CM | POA: Diagnosis present

## 2018-12-07 DIAGNOSIS — J449 Chronic obstructive pulmonary disease, unspecified: Secondary | ICD-10-CM | POA: Insufficient documentation

## 2018-12-07 DIAGNOSIS — I110 Hypertensive heart disease with heart failure: Secondary | ICD-10-CM | POA: Diagnosis not present

## 2018-12-07 DIAGNOSIS — Z87891 Personal history of nicotine dependence: Secondary | ICD-10-CM | POA: Diagnosis not present

## 2018-12-07 DIAGNOSIS — Z794 Long term (current) use of insulin: Secondary | ICD-10-CM | POA: Insufficient documentation

## 2018-12-07 DIAGNOSIS — E119 Type 2 diabetes mellitus without complications: Secondary | ICD-10-CM | POA: Diagnosis not present

## 2018-12-07 DIAGNOSIS — J42 Unspecified chronic bronchitis: Secondary | ICD-10-CM

## 2018-12-07 DIAGNOSIS — Z7901 Long term (current) use of anticoagulants: Secondary | ICD-10-CM | POA: Diagnosis not present

## 2018-12-07 DIAGNOSIS — I890 Lymphedema, not elsewhere classified: Secondary | ICD-10-CM | POA: Diagnosis not present

## 2018-12-07 DIAGNOSIS — Z79899 Other long term (current) drug therapy: Secondary | ICD-10-CM | POA: Insufficient documentation

## 2018-12-07 DIAGNOSIS — M069 Rheumatoid arthritis, unspecified: Secondary | ICD-10-CM | POA: Insufficient documentation

## 2018-12-07 DIAGNOSIS — I10 Essential (primary) hypertension: Secondary | ICD-10-CM

## 2018-12-07 DIAGNOSIS — I480 Paroxysmal atrial fibrillation: Secondary | ICD-10-CM

## 2018-12-07 NOTE — Patient Instructions (Signed)
Continue weighing daily and call for an overnight weight gain of > 2 pounds or a weekly weight gain of >5 pounds. 

## 2018-12-23 ENCOUNTER — Ambulatory Visit: Payer: Medicare Other | Admitting: Podiatry

## 2019-08-30 DEATH — deceased

## 2021-05-10 IMAGING — CT CT ABDOMEN AND PELVIS WITHOUT CONTRAST
2 of 4 series · 16 of 46 positions shown, 18 images · non-contrast
Comparison: CT scan of September 22, 2007.

CLINICAL DATA: Renal failure, shortness of breath.

EXAM:
CT ABDOMEN AND PELVIS WITHOUT CONTRAST
TECHNIQUE: Multidetector CT imaging of the abdomen and pelvis was performed
following the standard protocol without IV contrast.

[Series 2: routine abd/(person_name) (person_name) · axial · 0.69mm/px · z∈[-475,-115]mm · 13 of 80 slices shown, 15 images]
[im 4/80  soft-tissue]
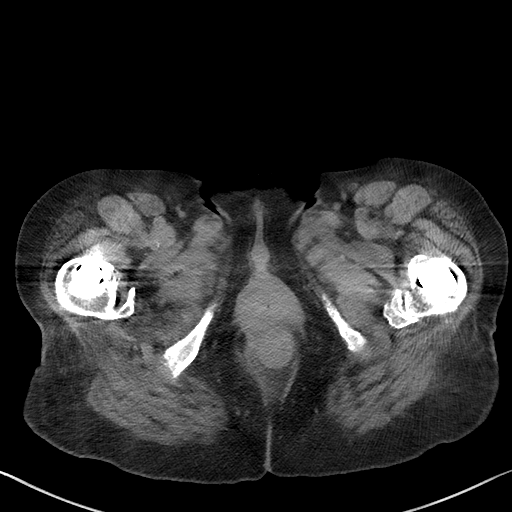
[im 4/80  bone]
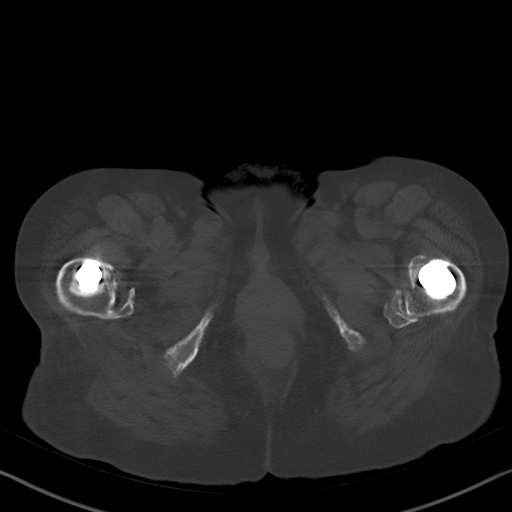
[im 10/80  soft-tissue]
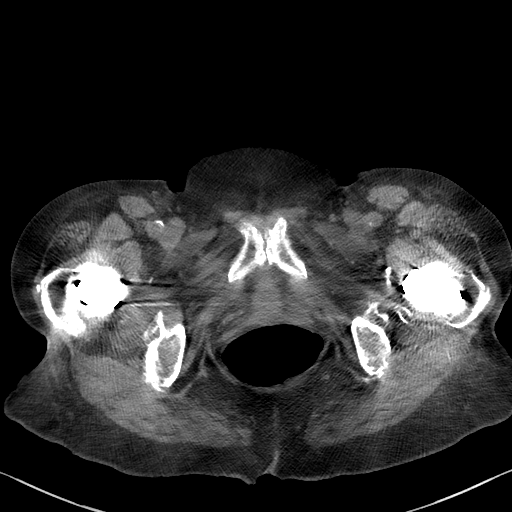
[im 17/80  soft-tissue]
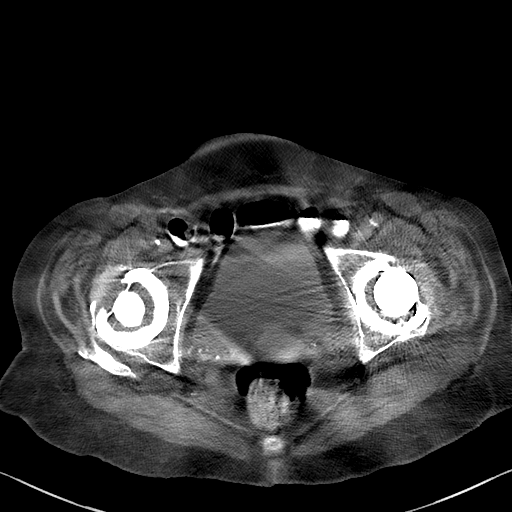
[im 24/80  soft-tissue]
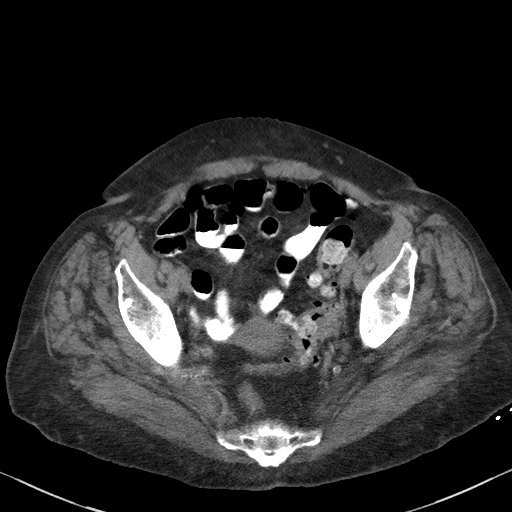
[im 27/80  soft-tissue]
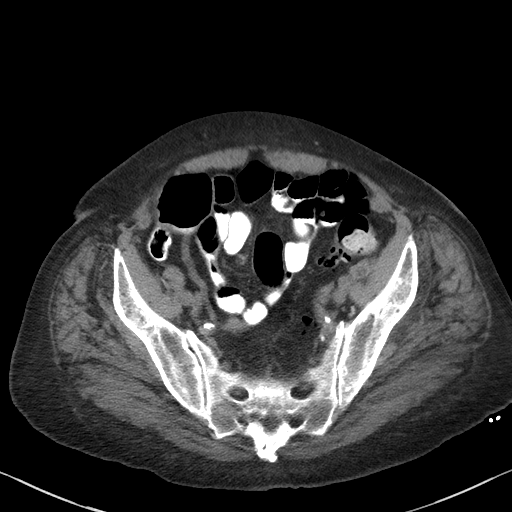
[im 33/80  soft-tissue]
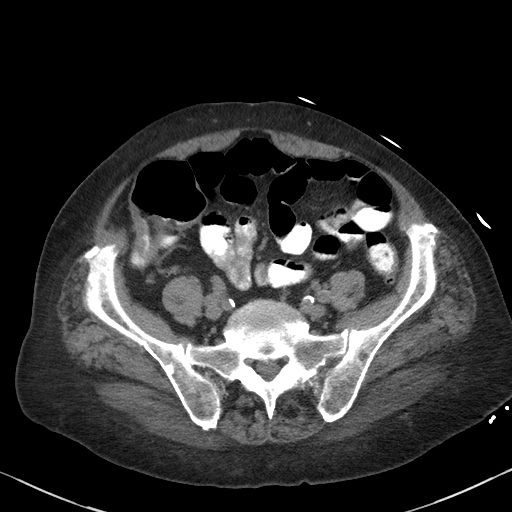
[im 40/80  soft-tissue]
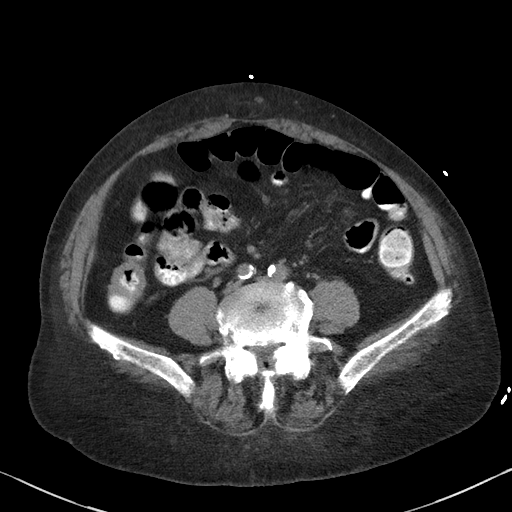
[im 47/80  soft-tissue]
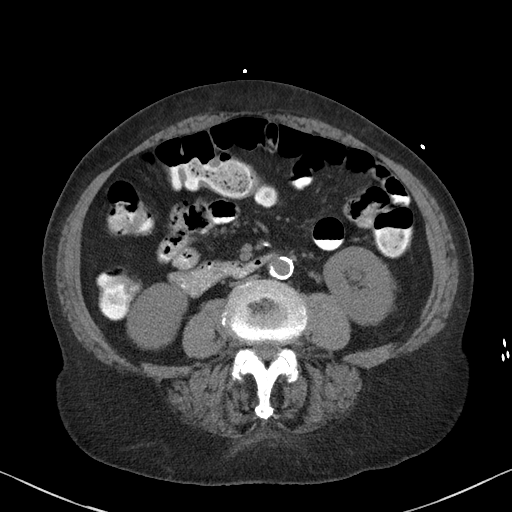
[im 53/80  soft-tissue]
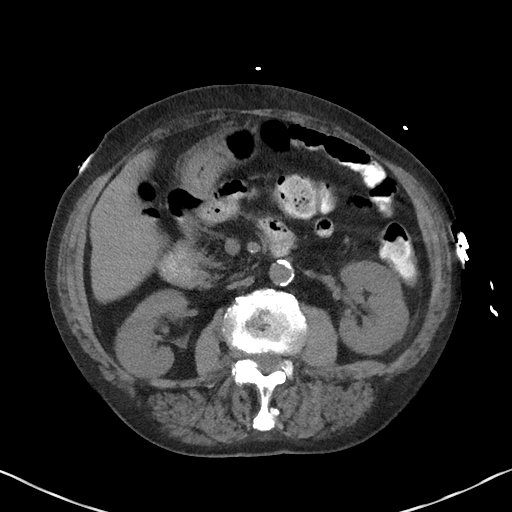
[im 53/80  bone]
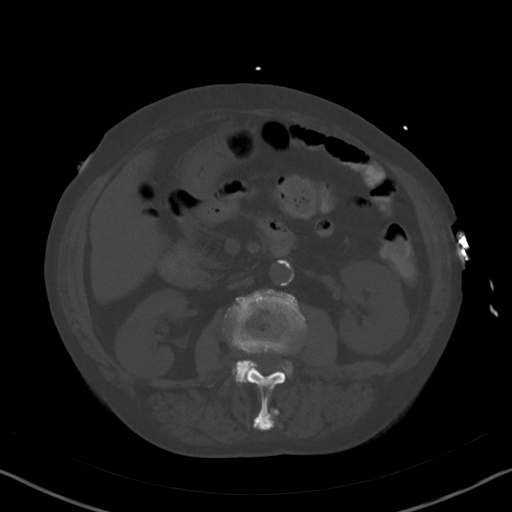
[im 56/80  soft-tissue]
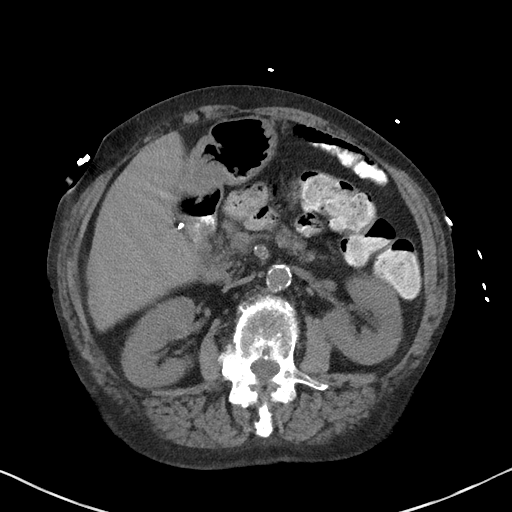
[im 63/80  soft-tissue]
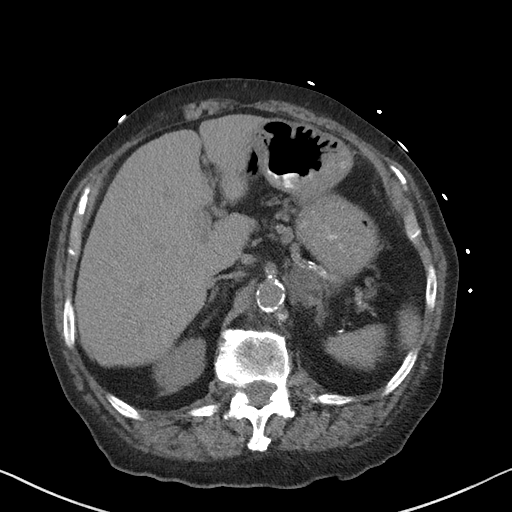
[im 70/80  soft-tissue]
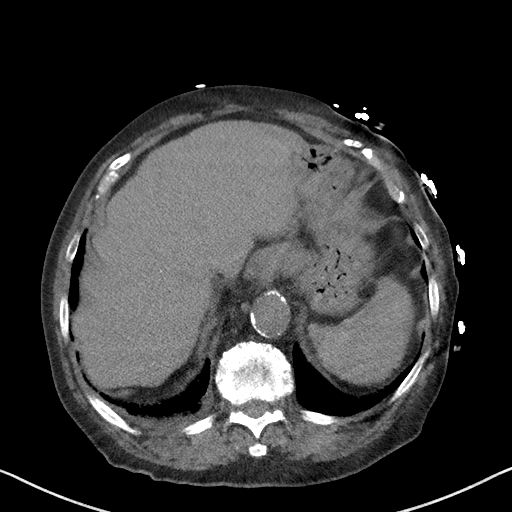
[im 76/80  soft-tissue]
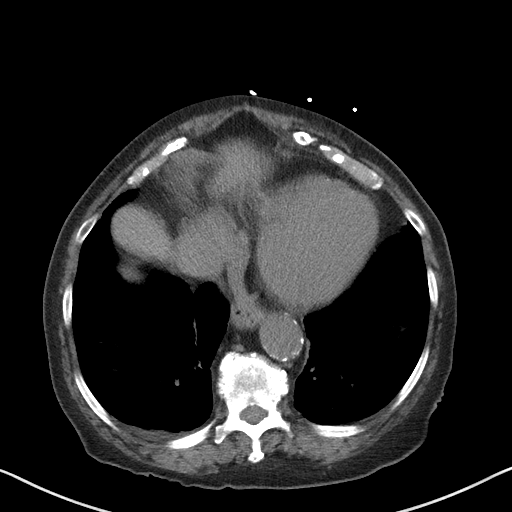

[Series 5: coronal st · coronal · 0.64mm/px · 3 of 96 slices shown]
[im 32/96  soft-tissue]
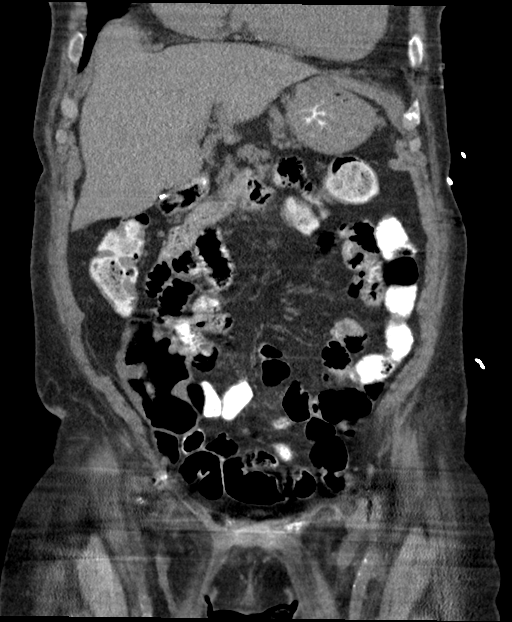
[im 43/96  soft-tissue]
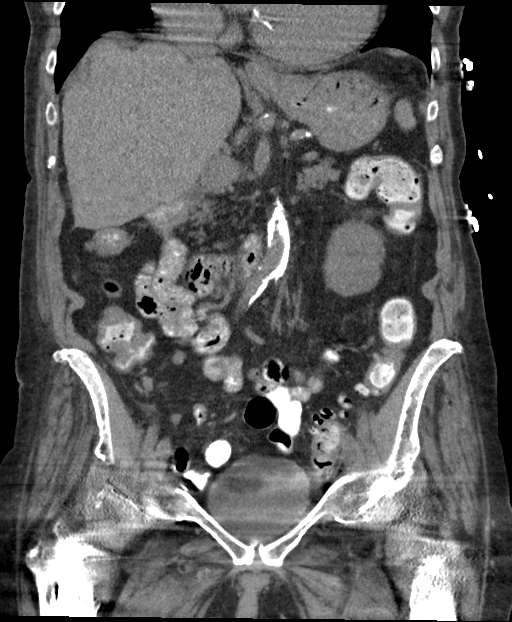
[im 53/96  soft-tissue]
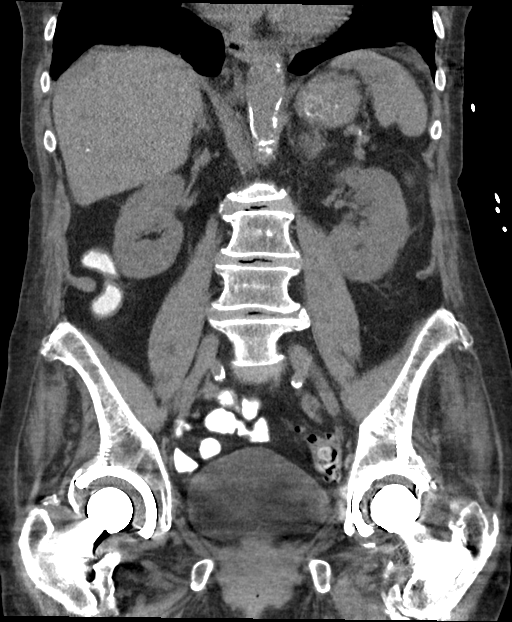

[16 of 46 positions shown; findings below may reference images not displayed]

FINDINGS: Lower chest: No acute abnormality.

Hepatobiliary: No focal liver abnormality is seen. No gallstones,
gallbladder wall thickening, or biliary dilatation.

Pancreas: Unremarkable. No pancreatic ductal dilatation or
surrounding inflammatory changes.

Spleen: Normal in size without focal abnormality.

Adrenals/Urinary Tract: Stable left adrenal adenoma is noted. Right
adrenal gland appears normal. No hydronephrosis or renal obstruction
is noted. No renal or ureteral calculi are noted. Urinary bladder is
unremarkable.

Stomach/Bowel: Stomach is within normal limits. Appendix appears
normal. No evidence of bowel wall thickening, distention, or
inflammatory changes. Diverticulosis of sigmoid colon is noted
without inflammation.

Vascular/Lymphatic: Aortic atherosclerosis. No enlarged abdominal or
pelvic lymph nodes.

Reproductive: Uterus and bilateral adnexa are unremarkable.

Other: No abdominal wall hernia or abnormality. No abdominopelvic
ascites.

Musculoskeletal: Status post bilateral total hip arthroplasties. No
acute osseous abnormality is noted.
IMPRESSION: No acute abnormality seen in the abdomen or pelvis.

Sigmoid diverticulosis without inflammation.

Stable left adrenal adenoma.

Aortic Atherosclerosis ([id number]).
